# Patient Record
Sex: Male | Born: 1970 | Race: White | Hispanic: No | Marital: Married | Smoking: Current every day smoker
Health system: Southern US, Community
[De-identification: ages and names within clinical notes are randomized; demographics above are authoritative.]

## PROBLEM LIST (undated history)

## (undated) DIAGNOSIS — R569 Unspecified convulsions: Secondary | ICD-10-CM

## (undated) DIAGNOSIS — I10 Essential (primary) hypertension: Secondary | ICD-10-CM

## (undated) HISTORY — PX: OTHER SURGICAL HISTORY: SHX169

## (undated) HISTORY — DX: Essential (primary) hypertension: I10

---

## 2001-01-25 ENCOUNTER — Emergency Department (HOSPITAL_COMMUNITY): Admission: AC | Admit: 2001-01-25 | Discharge: 2001-01-25 | Payer: Self-pay

## 2001-01-25 ENCOUNTER — Encounter: Payer: Self-pay | Admitting: Emergency Medicine

## 2012-08-08 ENCOUNTER — Encounter (HOSPITAL_COMMUNITY): Payer: Self-pay | Admitting: Emergency Medicine

## 2012-08-08 ENCOUNTER — Emergency Department (HOSPITAL_COMMUNITY)
Admission: EM | Admit: 2012-08-08 | Discharge: 2012-08-08 | Disposition: A | Discharge status: Home / Self Care | Payer: Managed Care, Other (non HMO) | Attending: Emergency Medicine | Admitting: Emergency Medicine

## 2012-08-08 DIAGNOSIS — Z79899 Other long term (current) drug therapy: Secondary | ICD-10-CM | POA: Insufficient documentation

## 2012-08-08 DIAGNOSIS — G40909 Epilepsy, unspecified, not intractable, without status epilepticus: Secondary | ICD-10-CM | POA: Insufficient documentation

## 2012-08-08 DIAGNOSIS — R569 Unspecified convulsions: Secondary | ICD-10-CM

## 2012-08-08 HISTORY — DX: Unspecified convulsions: R56.9

## 2012-08-08 LAB — COMPREHENSIVE METABOLIC PANEL
ALT: 17 U/L (ref 0–53)
AST: 16 U/L (ref 0–37)
Albumin: 4 g/dL (ref 3.5–5.2)
Alkaline Phosphatase: 67 U/L (ref 39–117)
Chloride: 94 mEq/L — ABNORMAL LOW (ref 96–112)
Potassium: 3.8 mEq/L (ref 3.5–5.1)
Sodium: 130 mEq/L — ABNORMAL LOW (ref 135–145)
Total Protein: 7.1 g/dL (ref 6.0–8.3)

## 2012-08-08 MED ORDER — SODIUM CHLORIDE 0.9 % IV SOLN
1000.0000 mg | Freq: Once | INTRAVENOUS | Status: AC
  Administered 2012-08-08: 1000 mg via INTRAVENOUS
  Filled 2012-08-08: qty 10

## 2012-08-08 MED ORDER — LEVETIRACETAM 500 MG PO TABS: 500.0000 mg | ORAL_TABLET | Freq: Two times a day (BID) | ORAL | Status: DC

## 2012-08-08 NOTE — ED Notes (Signed)
Patient discharged using the teach back method patient and his wife both verbalize an understanding.

## 2012-08-08 NOTE — ED Provider Notes (Signed)
Patient had generalized seizure today witnessed by coworkers. His wife reports that he's had several seizures within the past few months. He is presently asymptomatic alert Glasgow Coma Score 15. Case discussed with Dr. Roseanne Reno from neurology plan Keppra IV load prescription One-month supply.  Doug Sou, MD 08/08/12 1249

## 2012-08-08 NOTE — ED Provider Notes (Signed)
Medical screening examination/treatment/procedure(s) were conducted as a shared visit with non-physician practitioner(s) and myself.  I personally evaluated the patient during the encounter  Doug Sou, MD 08/08/12 1744

## 2012-08-08 NOTE — ED Provider Notes (Signed)
History     CSN: [id number]  Arrival date & time 08/08/12  1039   First MD Initiated Contact with Patient 08/08/12 1055      Chief Complaint  Patient presents with  . Seizures    (Consider location/radiation/quality/duration/timing/severity/associated sxs/prior treatment) HPI Comments: Pt presents to the ED for a seizure at work this am.  Seizure was witness by co-worker who states that he was convulsing for approx 30 mins and foaming at the mouth.  Head was protected during seizure and did not sustain any bodily injuries.  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  Was taken off by his PCP because they felt it was no longer needed.  Wife notes he has been having more frequent seizures, approx 6 episodes, over the past few months.  Recent EtOH use yesterday, approx 7 beers but denies illicit drug use.  Denies any chest pain, SOB, headache, dizziness, blurred vision, or weakness.  Patient is a 42 y.o. male presenting with seizures. The history is provided by the patient.  Seizures   Past Medical History  Diagnosis Date  . Seizures     History reviewed. No pertinent past surgical history.  History reviewed. No pertinent family history.  History  Substance Use Topics  . Smoking status: Not on file  . Smokeless tobacco: Not on file  . Alcohol Use: Not on file      Review of Systems  Neurological: Positive for seizures.  All other systems reviewed and are negative.    Allergies  Review of patient's allergies indicates no known allergies.  Home Medications   Current Outpatient Rx  Name  Route  Sig  Dispense  Refill  . loratadine (CLARITIN) 10 MG tablet   Oral   Take 10 mg by mouth daily.         Marland Kitchen olmesartan (BENICAR) 40 MG tablet   Oral   Take 20 mg by mouth daily.           BP 134/78  Temp(Src) 99 F (37.2 C) (Oral)  Resp 20  SpO2 95%  Physical Exam  Nursing note and vitals  reviewed. Constitutional: He is oriented to person, place, and time. He appears well-developed and well-nourished.  HENT:  Head: Normocephalic and atraumatic.  Right Ear: Tympanic membrane and ear canal normal.  Left Ear: Tympanic membrane and ear canal normal.  Mouth/Throat: Uvula is midline, oropharynx is clear and moist and mucous membranes are normal. No oropharyngeal exudate, posterior oropharyngeal edema, posterior oropharyngeal erythema or tonsillar abscesses.  Eyes: Conjunctivae and EOM are normal. Pupils are equal, round, and reactive to light.  Neck: Normal range of motion. Neck supple. No spinous process tenderness and no muscular tenderness present. No rigidity.  Cardiovascular: Normal rate, regular rhythm and normal heart sounds.   Pulmonary/Chest: Effort normal and breath sounds normal.  Abdominal: Soft. Bowel sounds are normal.  Neurological: He is alert and oriented to person, place, and time. He has normal strength. He displays no tremor. No cranial nerve deficit or sensory deficit. He displays no seizure activity.  strenth equal UE and LE bilaterally, no active seizures or tremors  Skin: Skin is warm and dry.  Psychiatric: He has a normal mood and affect.    ED Course  Procedures (including critical care time)   Date: 08/08/2012  Rate: 108  Rhythm: sinus tachycardia  QRS Axis: normal  Intervals: normal  ST/T Wave abnormalities: normal  Conduction Disutrbances:none  Narrative  Interpretation: sinus tach, otherwise normal EKG  Old EKG Reviewed: none available    Labs Reviewed  COMPREHENSIVE METABOLIC PANEL - Abnormal; Notable for the following:    Sodium 130 (*)    Chloride 94 (*)    All other components within normal limits   No results found.   1. Seizure       MDM   Pt presents to the ED for witnessed seizure this am at work.  No head trauma or external injuries.  Prior hx of childhood seizure d/o but not currently medicated.  Wife reports more  frequent episodes of seizures over the past 6 months.  Consulted neurology- advised to load with 1000mg  Keppra followed with Keppra 500mg  BID.  Advised to limit EtOH use while taking this medication.  Follow up with [facility].  Discussed plan with pt and wife- they agreed.  Return precautions advised.       Garlon Hatchet, PA-C 08/08/12 1511
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
## 2012-08-24 ENCOUNTER — Ambulatory Visit (INDEPENDENT_AMBULATORY_CARE_PROVIDER_SITE_OTHER): Payer: Managed Care, Other (non HMO) | Admitting: Neurology

## 2012-08-24 ENCOUNTER — Telehealth: Payer: Self-pay

## 2012-08-24 ENCOUNTER — Encounter: Payer: Self-pay | Admitting: Neurology

## 2012-08-24 VITALS — BP 130/84 | HR 86 | Ht 70.0 in | Wt 206.0 lb

## 2012-08-24 DIAGNOSIS — R569 Unspecified convulsions: Secondary | ICD-10-CM | POA: Insufficient documentation

## 2012-08-24 MED ORDER — LEVETIRACETAM 500 MG PO TABS: 500.0000 mg | ORAL_TABLET | Freq: Two times a day (BID) | ORAL | Status: DC

## 2012-08-24 NOTE — Telephone Encounter (Signed)
Mark Tanner called and said they would like rx sent to [facility] rather than [facility].  I have resent rx.

## 2012-08-24 NOTE — Progress Notes (Signed)
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 Was taken off by his PCP because they felt it was no longer needed. Wife notes he has been having more frequent seizures, approx 6 episodes, over the past few months. Recent EtOH use yesterday, approx 7 beers but denies illicit drug use"  Wife only reported one episode of starring about 3 years ago, came out of a daze, ask his wife who she is.  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   Constitutional:   N/A Cardiovascular:  N/A Ear/Nose/Throat:  Ringing in his ears Endocrine; increased thirst Skin: N/A Eyes: N/A Respiratory: N/A Gastroitestinal: N/A    Hematology/Lymphatic:  N/A Endocrine:  N/A Musculoskeletal: Joints pain Allergy/Immunology: Seizure Neurological: N/A Psychiatric:    N/A  PHYSICAL EXAMINATOINS:  Generalized: In no acute distress  Neck: Supple, no carotid bruits   Cardiac: Regular rate rhythm  Pulmonary: Clear to auscultation bilaterally  Musculoskeletal: No deformity  Neurological examination  Mentation: Alert oriented to time, place, history taking, and  causual conversation  Cranial nerve II-XII: Pupils were equal round reactive to light extraocular movements were full, visual field were full on confrontational test. facial sensation and strength were normal. hearing was intact to finger rubbing bilaterally. Uvula tongue midline.  head turning and shoulder shrug and were normal and symmetric.Tongue protrusion into cheek strength was normal.  Motor: normal tone, bulk and strength.  Sensory: Intact to fine touch, pinprick, preserved vibratory sensation, and proprioception at toes.  Coordination: Normal finger to nose, heel-to-shin bilaterally there was no truncal ataxia  Gait: Rising up from seated position without assistance, normal stance, without trunk ataxia, moderate stride, good arm swing, smooth turning, able to perform tiptoe, and heel walking without difficulty.   Romberg signs: Negative  Deep tendon reflexes: Brachioradialis 2/2, biceps 2/2, triceps 2/2, patellar 2/2, Achilles 2/2, plantar responses were flexor bilaterally.   Assessment and plan 42  Years old right-handed Caucasian male, with past medical history of seizure, now has recurrent seizure 1 complete evaluation was MRI of the brain with and without contrast 2. EEG 3 refill keppra 500 twice a day 4 RTC in 6 months with Eber Jones

## 2012-10-08 ENCOUNTER — Ambulatory Visit (INDEPENDENT_AMBULATORY_CARE_PROVIDER_SITE_OTHER): Payer: Managed Care, Other (non HMO)

## 2012-10-08 DIAGNOSIS — R569 Unspecified convulsions: Secondary | ICD-10-CM

## 2012-10-08 MED ORDER — GADOPENTETATE DIMEGLUMINE 469.01 MG/ML IV SOLN: 20.0000 mL | Freq: Once | INTRAVENOUS | Status: AC | PRN

## 2012-10-12 ENCOUNTER — Telehealth: Payer: Self-pay | Admitting: Neurology

## 2012-10-12 NOTE — Progress Notes (Signed)
Quick Note:  Called and spoke to patient normal MRI. Patient understood. ______ 

## 2013-03-01 ENCOUNTER — Telehealth: Payer: Self-pay | Admitting: *Deleted

## 2013-03-01 NOTE — Telephone Encounter (Signed)
Called patient left a message that their new appt time is 03-12-13 at 2pm arrive at 145pm. If they can not make this time to call back and r/s.

## 2013-03-05 ENCOUNTER — Ambulatory Visit: Payer: Managed Care, Other (non HMO) | Admitting: Nurse Practitioner

## 2013-03-12 ENCOUNTER — Encounter: Payer: Self-pay | Admitting: Nurse Practitioner

## 2013-03-12 ENCOUNTER — Ambulatory Visit (INDEPENDENT_AMBULATORY_CARE_PROVIDER_SITE_OTHER): Payer: Managed Care, Other (non HMO) | Admitting: Nurse Practitioner

## 2013-03-12 ENCOUNTER — Encounter (INDEPENDENT_AMBULATORY_CARE_PROVIDER_SITE_OTHER): Payer: Self-pay

## 2013-03-12 VITALS — BP 133/79 | HR 96 | Ht 71.5 in | Wt 200.0 lb

## 2013-03-12 DIAGNOSIS — R569 Unspecified convulsions: Secondary | ICD-10-CM

## 2013-03-12 NOTE — Progress Notes (Signed)
[facility]  PATIENT: Mark Tanner DOB: 15-Mar-1971   REASON FOR VISIT: Followup for seizure disorder   HISTORY OF PRESENT ILLNESS: Mr. Tally, 42 year old male returns for followup with history of generalized seizure. He is currently on Keppra 500 twice daily tolerating the medication without side effects. He has not had seizure activity since last seen 08/24/2012 when seen by Dr. Terrace Arabia. He is back to driving. MRI of the brain 08/24/2012 was normal.    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 Was taken off by his PCP because they felt it was no longer needed. Wife notes he has been having more frequent seizures, approx 6 episodes, over the past few months. Recent EtOH use yesterday, approx 7 beers but denies illicit drug use"  Wife only reported one episode of starring about 3 years ago, came out of a daze, ask his wife who she is.  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 levETIRAcetam (KEPPRA) 500 MG tablet Take 1 tablet (500 mg total) by mouth 2 (two) times daily.  60 tablet  12  . loratadine (CLARITIN) 10 MG tablet Take 10 mg by mouth daily.      Marland Kitchen olmesartan (BENICAR) 40 MG tablet Take 20 mg by mouth daily.      Marland Kitchen omeprazole (PRILOSEC) 40 MG capsule 40 mg 2 (two) times daily.       No facility-administered medications prior to visit.    PAST MEDICAL HISTORY: Past Medical History  Diagnosis Date  . Seizures     PAST SURGICAL HISTORY: Past Surgical History  Procedure Laterality Date  . None      FAMILY HISTORY: Family History  Problem Relation Age of Onset  . Cancer - Colon Father     SOCIAL HISTORY: History   Social History  . Marital Status: Married    Spouse Name: Elease Hashimoto    Number of Children: 0  . Years of Education: 12   Occupational History  .     Social History Main Topics  . Smoking status: Current Every Day Smoker    Types: Cigarettes  . Smokeless tobacco: Never Used  . Alcohol Use: 0.6 oz/week    1 Cans of beer per week  . Drug Use: No  . Sexual Activity: Not on file   Other Topics Concern  . Not on file   Social History Narrative   Patient is married Elease Hashimoto) and lives with his wife.   Patient works full-time.   Patient has a high school education.  Patient is right-handed.   Patient drinks three caffeine drinks daily.     PHYSICAL EXAM  Filed Vitals:   03/12/13 1343  BP: 133/79  Pulse: 96  Height: 5' 11.5" (1.816 m)  Weight: 200 lb (90.719 kg)   Body mass index is 27.51 kg/(m^2).  Generalized: Well developed, in no acute distress  Neurological examination   Mentation: Alert oriented to time, place, history taking. Follows all commands speech and language fluent  Cranial nerve II-XII: Fundoscopic exam reveals sharp disc margins.Pupils were equal round reactive to light extraocular movements were full,  visual field were full on confrontational test. Facial sensation and strength were normal. hearing was intact to finger rubbing bilaterally. Uvula tongue midline. head turning and shoulder shrug and were normal and symmetric.Tongue protrusion into cheek strength was normal. Motor: normal bulk and tone, full strength in the BUE, BLE, fine finger movements normal, no pronator drift. No focal weakness Sensory: normal and symmetric to light touch, pinprick, and  vibration  Coordination: finger-nose-finger, heel-to-shin bilaterally, no dysmetria Reflexes: Brachioradialis 2/2, biceps 2/2, triceps 2/2, patellar 2/2, Achilles 2/2, plantar responses were flexor bilaterally. Gait and Station: Rising up from seated position without assistance, normal stance,  moderate stride, good arm swing, smooth turning, able to perform tiptoe, and heel walking without difficulty.   DIAGNOSTIC DATA (LABS, IMAGING, TESTING) - I reviewed patient records, labs, notes, testing and imaging myself where available.      Component Value Date/Time   NA 130* 08/08/2012 1111   K 3.8 08/08/2012 1111   CL 94* 08/08/2012 1111   CO2 27 08/08/2012 1111   GLUCOSE 99 08/08/2012 1111   BUN 16 08/08/2012 1111   CREATININE 0.93 08/08/2012 1111   CALCIUM 9.0 08/08/2012 1111   PROT 7.1 08/08/2012 1111   ALBUMIN 4.0 08/08/2012 1111   AST 16 08/08/2012 1111   ALT 17 08/08/2012 1111   ALKPHOS 67 08/08/2012 1111   BILITOT 0.3 08/08/2012 1111   GFRNONAA >90 08/08/2012 1111   GFRAA >90 08/08/2012 1111    ASSESSMENT AND PLAN  42 y.o. year old male  has a past medical history of Seizures. here to followup. Currently on Keppra 500 twice a day. MRI of the brain 08/24/2012 with normal.  Continue Keppra 500 twice daily Does not need refills Call for any seizure activity Limit alcohol as it lowers seizure threshold Followup yearly and when necessary Nilda Riggs, [facility], [facility], APRN  [facility] [address] [place], [place]  [address] [phone]

## 2013-03-12 NOTE — Patient Instructions (Signed)
Continue Keppra 500 twice daily Does not need refills Call for any seizure activity Followup yearly and when necessary

## 2013-06-14 ENCOUNTER — Other Ambulatory Visit: Payer: Self-pay

## 2013-06-14 MED ORDER — LEVETIRACETAM 500 MG PO TABS: 500.0000 mg | ORAL_TABLET | Freq: Two times a day (BID) | ORAL | Status: DC

## 2013-12-09 ENCOUNTER — Telehealth: Payer: Self-pay | Admitting: Neurology

## 2013-12-09 NOTE — Telephone Encounter (Signed)
Patient's wife [name] calling to ask if it is okay for patient to take hydrocodone and amoxicillin with his keppra, patient had 2 wisdom teeth pulled today and that's what they gave the patient, please call and advise, she wants to know before tonight if she can give him these meds.

## 2013-12-10 NOTE — Telephone Encounter (Signed)
Left message that it is ok for patient to take hydrocodone and amoxicillin with his Keppra.  Told to call with further questions.

## 2013-12-10 NOTE — Telephone Encounter (Signed)
Butch Penny: Please let patient wife know, yes, it is ok for him to take hydrocodone and amoxicllin, he should continue his keppra as previously ordered.

## 2014-03-18 ENCOUNTER — Ambulatory Visit (INDEPENDENT_AMBULATORY_CARE_PROVIDER_SITE_OTHER): Payer: Managed Care, Other (non HMO) | Admitting: Neurology

## 2014-03-18 ENCOUNTER — Encounter: Payer: Self-pay | Admitting: Neurology

## 2014-03-18 VITALS — BP 128/76 | HR 87 | Ht 71.5 in | Wt 196.0 lb

## 2014-03-18 DIAGNOSIS — R569 Unspecified convulsions: Secondary | ICD-10-CM

## 2014-03-18 DIAGNOSIS — I10 Essential (primary) hypertension: Secondary | ICD-10-CM

## 2014-03-18 MED ORDER — LEVETIRACETAM 500 MG PO TABS: 500.0000 mg | ORAL_TABLET | Freq: Two times a day (BID) | ORAL | Status: DC

## 2014-03-18 NOTE — Progress Notes (Signed)
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 Was taken off by his PCP because they felt it was no longer needed. Wife notes he has been having more frequent seizures, approx 6 episodes, over the past few months. Also reported a history of alcohol use, approx 7 beers but denies illicit drug use"   Wife only reported one episode of starring 2010, came out of a daze, ask his wife who she is.  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 benzonatate (TESSALON) 100 MG capsule 100 mg as needed.    . cefdinir (OMNICEF) 300 MG capsule 300 mg.    . levETIRAcetam (KEPPRA) 500 MG tablet Take 1 tablet (500 mg total) by mouth 2 (two) times daily. 180 tablet 2  . loratadine (CLARITIN) 10 MG tablet Take 10 mg by mouth daily.    Marland Kitchen olmesartan (BENICAR) 40 MG tablet Take 20 mg by mouth daily.    Marland Kitchen omeprazole (PRILOSEC) 40 MG capsule 40 mg 2 (two) times daily.     No facility-administered medications prior to visit.    PAST MEDICAL HISTORY: Past Medical History  Diagnosis Date  . Seizures   . High blood pressure     PAST SURGICAL  HISTORY: Past Surgical History  Procedure Laterality Date  . None      FAMILY HISTORY: Family History  Problem Relation Age of Onset  . Cancer - Colon Father     SOCIAL HISTORY: History   Social History  . Marital Status: Married    Spouse Name: [name]    Number of Children: 0  . Years of Education: 12   Occupational History  .     Social History Main Topics  . Smoking status: Current Every Day Smoker    Types: Cigarettes  . Smokeless tobacco: Never Used  . Alcohol Use: 0.6 oz/week    1 Cans of beer per week  . Drug Use: No  . Sexual Activity: Not on file   Other Topics Concern  . Not on file   Social History Narrative   Patient is married [name]) and lives with his wife.   Patient works full-time.   Patient has a high school education.   Patient is right-handed.   Patient drinks three caffeine drinks daily.     PHYSICAL EXAM  Filed Vitals:   03/18/14 0835  BP: 128/76  Pulse: 87  Height: 5' 11.5" (1.816 m)  Weight: 196 lb (88.905 kg)   Body mass index is 26.96 kg/(m^2).  Generalized: Well developed, in no acute distress  Neurological examination   Mentation: Alert oriented to time, place, history taking. Follows all commands speech and language fluent  Cranial nerve II-XII: Fundoscopic exam reveals sharp disc margins.Pupils were equal round reactive to light extraocular movements were full, visual field were full on confrontational test. Facial sensation and strength were normal. hearing was intact to finger rubbing bilaterally. Uvula tongue midline. head turning and shoulder shrug and were normal and symmetric.Tongue protrusion into cheek strength was normal. Motor: normal bulk and tone, full strength in the BUE, BLE, fine finger movements normal, no pronator drift. No focal weakness Sensory: normal and symmetric to light touch, pinprick, and  vibration  Coordination: finger-nose-finger, heel-to-shin bilaterally, no dysmetria Reflexes:  Brachioradialis 2/2, biceps 2/2, triceps 2/2, patellar 2/2, Achilles 2/2, plantar responses were flexor bilaterally. Gait and Station: Rising up from seated position without assistance, normal stance,  moderate stride, good arm swing, smooth turning, able to perform tiptoe, and heel walking without difficulty.   DIAGNOSTIC DATA (LABS, IMAGING, TESTING) - I reviewed patient records, labs, notes, testing and imaging myself where available.      Component Value Date/Time   NA 130* 08/08/2012 1111   K 3.8 08/08/2012 1111   CL 94* 08/08/2012 1111   CO2 27 08/08/2012 1111   GLUCOSE 99 08/08/2012 1111   BUN 16 08/08/2012 1111   CREATININE 0.93 08/08/2012 1111   CALCIUM 9.0 08/08/2012 1111   PROT 7.1 08/08/2012 1111   ALBUMIN 4.0 08/08/2012 1111   AST 16 08/08/2012 1111   ALT 17 08/08/2012 1111   ALKPHOS 67 08/08/2012 1111   BILITOT 0.3 08/08/2012 1111   GFRNONAA >90 08/08/2012 1111   GFRAA >90 08/08/2012 1111    ASSESSMENT AND PLAN  43 y.o. year old male with past medical history of seizure, doing very well on Keppra 500 mg twice a day, no significant side effect,  Refill his medications, Return to clinic in one year with nurse practitioner  [facility] [address] [place], [place] [address] [phone]

## 2014-06-13 ENCOUNTER — Telehealth: Payer: Self-pay | Admitting: Neurology

## 2014-06-13 NOTE — Telephone Encounter (Signed)
Patient would like your clearance to start Losartan HCTZ due to his seizure disorder and Keppra.

## 2014-06-13 NOTE — Telephone Encounter (Signed)
Patient's spouse stated Dr. Tollie Pizza prescribed a new medication for blood pressure.  Spouse is questioning if it's ok to start taking Rx LOSARTIN HCT 50/12.5 mg, due to one of the side effects were seizures.  Please call and advise.

## 2014-06-13 NOTE — Telephone Encounter (Signed)
Mark Tanner: Please call back patient, it is okay for her to take blood pressure medications prescribed by her primary care physician. It has no interaction with keppra

## 2014-06-13 NOTE — Telephone Encounter (Signed)
Aware that is is safe to start new BP medication.

## 2015-01-12 ENCOUNTER — Telehealth: Payer: Self-pay | Admitting: Neurology

## 2015-01-12 MED ORDER — LEVETIRACETAM 500 MG PO TABS
500.0000 mg | ORAL_TABLET | Freq: Two times a day (BID) | ORAL | Status: DC
Start: 2015-01-12 — End: 2015-04-10

## 2015-01-12 NOTE — Telephone Encounter (Signed)
Patient has appt in Nov.  Refill sent to [facility] per patient request.  Receipt confirmed by pharmacy.  I called back to advise.  They are aware.

## 2015-01-12 NOTE — Telephone Encounter (Signed)
pts wife called and states he will run out of medication before appt. Can he have a rx. levETIRAcetam (KEPPRA) 500 MG tablet. Please use [facility] home delivery . Thank you. Wife would like a call about status [phone]

## 2015-03-06 ENCOUNTER — Ambulatory Visit (INDEPENDENT_AMBULATORY_CARE_PROVIDER_SITE_OTHER): Payer: Self-pay | Admitting: Adult Health

## 2015-03-06 ENCOUNTER — Encounter: Payer: Self-pay | Admitting: Adult Health

## 2015-03-06 VITALS — BP 142/88 | HR 83 | Ht 71.0 in | Wt 196.5 lb

## 2015-03-06 DIAGNOSIS — R569 Unspecified convulsions: Secondary | ICD-10-CM

## 2015-03-06 NOTE — Progress Notes (Signed)
PATIENT: Mark Tanner DOB: 11/28/1970  REASON FOR VISIT: follow up- seizures HISTORY FROM: patient  HISTORY OF PRESENT ILLNESS: Mr. Lekas is a 44 year old male with a history of seizures. He returns today for follow-up. He is currently taking Keppra 500 mg twice a day and tolerating it well. He reports that he has not had a seizure in over 3 years. Denies any changes with his gait or balance. He is able to complete all ADLs independently. He doesn't operate a Teacher, music. Unfortunately he reports that he was recently fired from his job of 17 years. He states that this has added a lot of stress. He is currently in the process of trying to secure another job. He denies any new medical issues. He returns today for follow-up.  HISTORY 03/18/14 Krista Blue): Refill medication for seizure  Most recent generalized seizure was in April 1st 2013,  Patient and his wife is very reluctant to provide his previous history of seizure, but from review emergency record, "Hx of childhood seizure disorder for which he was medicated- wife notes it was tegretol and dilantin. Was taken off by his PCP because they felt it was no longer needed. Wife notes he has been having more frequent seizures, approx 6 episodes, over the past few months. Also reported a history of alcohol use, approx 7 beers but denies illicit drug use"   Wife only reported one episode of starring 2010, came out of a daze, ask his wife who she is.  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  See history of present illness  ALLERGIES: No Known  Allergies  HOME MEDICATIONS: Outpatient Prescriptions Prior to Visit  Medication Sig Dispense Refill  . levETIRAcetam (KEPPRA) 500 MG tablet Take 1 tablet (500 mg total) by mouth 2 (two) times daily. 180 tablet 0  . loratadine (CLARITIN) 10 MG tablet Take 10 mg by mouth daily.    Marland Kitchen olmesartan (BENICAR) 40 MG tablet Take 20 mg by mouth daily.    . benzonatate (TESSALON) 100 MG capsule 100 mg as needed.    . cefdinir (OMNICEF) 300 MG capsule 300 mg.    . omeprazole (PRILOSEC) 40 MG capsule 40 mg 2 (two) times daily.     No facility-administered medications prior to visit.    PAST MEDICAL HISTORY: Past Medical History  Diagnosis Date  . Seizures ([place])   . High blood pressure     PAST SURGICAL HISTORY: Past Surgical History  Procedure Laterality Date  . None      FAMILY HISTORY: Family History  Problem Relation Age of Onset  . Cancer - Colon Father     SOCIAL HISTORY: Social History   Social History  . Marital Status: Married    Spouse Name: [name]  . Number of Children: 0  . Years of Education: 12   Occupational History  .     Social History Main Topics  . Smoking status: Current Every Day Smoker -- 1.00 packs/day    Types: Cigarettes  . Smokeless tobacco: Never Used  . Alcohol Use: 0.6 oz/week    1 Cans of beer per week  .  Drug Use: No  . Sexual Activity: Not on file   Other Topics Concern  . Not on file   Social History Narrative   Patient is married [name]) and lives with his wife.   Patient works full-time.   Patient has a high school education.   Patient is right-handed.   Patient drinks three caffeine drinks daily.      PHYSICAL EXAM  Filed Vitals:   03/06/15 0810  BP: 142/88  Pulse: 83  Height: 5\' 11"  (1.803 m)  Weight: 196 lb 8 oz (89.132 kg)   Body mass index is 27.42 kg/(m^2).  Generalized: Well developed, in no acute distress   Neurological examination  Mentation: Alert oriented to time, place, history taking. Follows all  commands speech and language fluent Cranial nerve II-XII: Pupils were equal round reactive to light. Extraocular movements were full, visual field were full on confrontational test. Facial sensation and strength were normal. Uvula tongue midline. Head turning and shoulder shrug  were normal and symmetric. Motor: The motor testing reveals 5 over 5 strength of all 4 extremities. Good symmetric motor tone is noted throughout.  Sensory: Sensory testing is intact to soft touch on all 4 extremities. No evidence of extinction is noted.  Coordination: Cerebellar testing reveals good finger-nose-finger and heel-to-shin bilaterally.  Gait and station: Gait is normal. Tandem gait is normal. Romberg is negative. No drift is seen.  Reflexes: Deep tendon reflexes are symmetric and normal bilaterally.   DIAGNOSTIC DATA (LABS, IMAGING, TESTING) - I reviewed patient records, labs, notes, testing and imaging myself where available.     ASSESSMENT AND PLAN 44 y.o. year old male  has a past medical history of Seizures ([place]) and High blood pressure. here with:  1. Seizures  Overall the patient is doing well. He will continue on Keppra 500 mg twice a day. I discussed with the patient potential triggers for seizures such as sleep deprivation, increased stress and missing medication. Patient verbalized understanding. If he has any seizure events he should let us know. He will follow-up in one year or sooner if needed.   Ward Givens, MSN, NP-C 03/06/2015, 9:01 AM [facility] [address], [place], [place] [address] [phone]

## 2015-03-06 NOTE — Progress Notes (Signed)
I have reviewed and agreed above plan. 

## 2015-03-06 NOTE — Patient Instructions (Addendum)
Continue Keppra If you have any seizure events please let us know.   

## 2015-03-17 ENCOUNTER — Ambulatory Visit: Payer: Managed Care, Other (non HMO) | Admitting: Adult Health

## 2015-03-27 ENCOUNTER — Ambulatory Visit: Payer: Managed Care, Other (non HMO) | Admitting: Adult Health

## 2015-04-03 ENCOUNTER — Ambulatory Visit: Payer: Managed Care, Other (non HMO) | Admitting: Adult Health

## 2015-04-03 ENCOUNTER — Telehealth: Payer: Self-pay | Admitting: Adult Health

## 2015-04-03 NOTE — Telephone Encounter (Signed)
Patient's wife is calling back and states she left a form at the front desk this morning for her husband to get assistance with his Rx Keppra.  She states she failed to mention a Rx needs to be attached to the form.  Thanks!

## 2015-04-06 NOTE — Telephone Encounter (Signed)
Noted  

## 2015-04-10 ENCOUNTER — Other Ambulatory Visit: Payer: Self-pay

## 2015-04-10 MED ORDER — LEVETIRACETAM 500 MG PO TABS: 500.0000 mg | ORAL_TABLET | Freq: Two times a day (BID) | ORAL | Status: DC

## 2015-04-10 NOTE — Telephone Encounter (Signed)
Patient's wife is calling back to see if form has been faxed to [facility].  Thanks!

## 2015-04-10 NOTE — Telephone Encounter (Signed)
All info was sent.  I called back to advise.  They expressed understanding and appreciation.

## 2015-04-10 NOTE — Telephone Encounter (Signed)
Rx has been signed and faxed  

## 2015-04-10 NOTE — Telephone Encounter (Signed)
Patient assist requires written Rx.  Thank you.

## 2015-04-11 NOTE — Telephone Encounter (Addendum)
Pt's wife called and states that she spoke with a rep from [employer] today and they do not have pts form faxed to them. May refax to [phone] or forms may be emailed to them [email or web address]. Please call and advise pt that it has been resent and the status. Thank you [phone]

## 2015-04-11 NOTE — Telephone Encounter (Signed)
The info has been sent.  We did receive confirmation from the program.  I called the program and spoke with Metta Clines.  She confirmed they did receive the enrollment, however, it is still being processed.  Asked that the patient allow another 24-48 hours for them to finish entering info.  I called the patient back.  Spoke with [name].  She expressed understanding, and will follow up with them in 24-48 hours.

## 2016-02-19 ENCOUNTER — Other Ambulatory Visit: Payer: Self-pay | Admitting: *Deleted

## 2016-02-19 MED ORDER — LEVETIRACETAM 500 MG PO TABS
500.0000 mg | ORAL_TABLET | Freq: Two times a day (BID) | ORAL | 3 refills | Status: DC
Start: 2016-02-19 — End: 2016-05-01

## 2016-03-05 ENCOUNTER — Ambulatory Visit: Payer: Self-pay | Admitting: Adult Health

## 2016-04-30 ENCOUNTER — Ambulatory Visit: Payer: Self-pay | Admitting: Adult Health

## 2016-05-01 ENCOUNTER — Encounter: Payer: Self-pay | Admitting: Neurology

## 2016-05-01 ENCOUNTER — Ambulatory Visit (INDEPENDENT_AMBULATORY_CARE_PROVIDER_SITE_OTHER): Payer: Managed Care, Other (non HMO) | Admitting: Neurology

## 2016-05-01 VITALS — BP 118/74 | HR 90 | Ht 71.0 in | Wt 186.0 lb

## 2016-05-01 DIAGNOSIS — R569 Unspecified convulsions: Secondary | ICD-10-CM

## 2016-05-01 MED ORDER — LEVETIRACETAM 500 MG PO TABS: 500.0000 mg | ORAL_TABLET | Freq: Two times a day (BID) | ORAL | 4 refills | Status: DC

## 2016-05-01 NOTE — Progress Notes (Signed)
PATIENT: Mark Tanner DOB: 16-Jun-1970  REASON FOR VISIT: follow up- seizures HISTORY FROM: patient  HISTORY OF PRESENT ILLNESS:  HISTORY 03/18/14 Krista Blue): Refill medication for seizure  Most recent generalized seizure was in April 1st 2013,  Patient and his wife is very reluctant to provide his previous history of seizure, but from review emergency record, "Hx of childhood seizure disorder for which he was medicated- wife notes it was tegretol and dilantin. Was taken off by his PCP because they felt it was no longer needed. Wife notes he has been having more frequent seizures, approx 6 episodes, over the past few months. Also reported a history of alcohol use, approx 7 beers but denies illicit drug use"   Wife only reported one episode of starring 2010, came out of a daze, ask his wife who she is.  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  See history of present illness  ALLERGIES: No Known Allergies  HOME MEDICATIONS: Outpatient Medications Prior to Visit  Medication Sig Dispense Refill  . levETIRAcetam (KEPPRA) 500 MG tablet Take 1 tablet (500 mg total) by mouth 2 (two) times daily. 180 tablet 3  . loratadine (CLARITIN) 10 MG tablet Take 10 mg by mouth daily.    Marland Kitchen olmesartan (BENICAR) 40 MG tablet Take 20 mg by mouth daily.     No facility-administered medications prior to visit.     PAST MEDICAL HISTORY: Past Medical History:  Diagnosis Date  . High blood pressure   . Seizures  ([place])     PAST SURGICAL HISTORY: Past Surgical History:  Procedure Laterality Date  . None      FAMILY HISTORY: Family History  Problem Relation Age of Onset  . Cancer - Colon Father     SOCIAL HISTORY: Social History   Social History  . Marital status: Married    Spouse name: [name]  . Number of children: 0  . Years of education: 12   Occupational History  .  [employer];   Social History Main Topics  . Smoking status: Current Every Day Smoker    Packs/day: 1.00    Types: Cigarettes  . Smokeless tobacco: Never Used  . Alcohol use 0.6 oz/week    1 Cans of beer per week  . Drug use: No  . Sexual activity: Not on file   Other Topics Concern  . Not on file   Social History Narrative   Patient is married [name]) and lives with his wife.   Patient works full-time.   Patient has a high school education.   Patient is right-handed.   Patient drinks three caffeine drinks daily.      PHYSICAL EXAM  Vitals:   05/01/16 0906  BP: 118/74  Pulse: 90  Weight: 186 lb (84.4 kg)  Height: 5\' 11"  (1.803 m)   Body mass index is 25.94 kg/m.  PHYSICAL EXAMNIATION:  Gen: NAD, conversant, well nourised, obese, well groomed  Cardiovascular: Regular rate rhythm, no peripheral edema, warm, nontender. Eyes: Conjunctivae clear without exudates or hemorrhage Neck: Supple, no carotid bruits. Pulmonary: Clear to auscultation bilaterally   NEUROLOGICAL EXAM:  MENTAL STATUS: Speech:    Speech is normal; fluent and spontaneous with normal comprehension.  Cognition:     Orientation to time, place and person     Normal recent and remote memory     Normal Attention span and concentration     Normal Language, naming, repeating,spontaneous speech     Fund of knowledge   CRANIAL NERVES: CN II: Visual fields are full to confrontation. Fundoscopic exam is normal with sharp discs and no vascular changes. Pupils are round equal and briskly reactive to  light. CN III, IV, VI: extraocular movement are normal. No ptosis. CN V: Facial sensation is intact to pinprick in all 3 divisions bilaterally. Corneal responses are intact.  CN VII: Face is symmetric with normal eye closure and smile. CN VIII: Hearing is normal to rubbing fingers CN IX, X: Palate elevates symmetrically. Phonation is normal. CN XI: Head turning and shoulder shrug are intact CN XII: Tongue is midline with normal movements and no atrophy.  MOTOR: There is no pronator drift of out-stretched arms. Muscle bulk and tone are normal. Muscle strength is normal.  REFLEXES: Reflexes are 2+ and symmetric at the biceps, triceps, knees, and ankles. Plantar responses are flexor.  SENSORY: Intact to light touch, pinprick, positional and vibratory sensation are intact in fingers and toes.  COORDINATION: Rapid alternating movements and fine finger movements are intact. There is no dysmetria on finger-to-nose and heel-knee-shin.    GAIT/STANCE: Posture is normal. Gait is steady with normal steps, base, arm swing, and turning. Heel and toe walking are normal. Tandem gait is normal.  Romberg is absent.  DIAGNOSTIC DATA (LABS, IMAGING, TESTING) - I reviewed patient records, labs, notes, testing and imaging myself where available.  ASSESSMENT AND PLAN 45 y.o. year old   Epilepsy: Overall the patient is doing well. He will continue on Keppra 500 mg twice a day Continue refill with his primary care physician  Marcial Pacas, M.D. Ph.D.  [facility] [place], [place] [address] Phone: [phone] Fax:      [phone]

## 2019-03-22 ENCOUNTER — Telehealth: Payer: Self-pay | Admitting: Neurology

## 2019-03-22 NOTE — Telephone Encounter (Signed)
I returned the call to the patient. He was last seen in our office in 04/2016.  He has been followed by his PCP since that time, who has been prescribing his Keppra.  He was instructed to call his PCP to request the refill.  Says he was last seen in their office three weeks ago for back pain.  He is concerned because he cannot go to his PCP right now for another appt because he has just lost his job.  He has enough medication to last him through the end of December.  I feel like his PCP will be helpful, if he has maintained a relationship with that office.  Additionally, I suggested he contact his local health dept/social services to apply for assistance while unemployed.  He verbalized understanding.

## 2019-03-22 NOTE — Telephone Encounter (Signed)
Pt is needing a refill on his levETIRAcetam (KEPPRA) 500 MG tablet It was explained that he has not been to the office for 2 years and is needing a medication f/u appt. Pt states he was informed that all he had to do was to call in his medication. Pt needs to discuss with RN.

## 2019-06-11 ENCOUNTER — Other Ambulatory Visit: Payer: Self-pay | Admitting: Orthopedic Surgery

## 2019-06-11 DIAGNOSIS — R1900 Intra-abdominal and pelvic swelling, mass and lump, unspecified site: Secondary | ICD-10-CM

## 2019-06-11 DIAGNOSIS — M545 Low back pain, unspecified: Secondary | ICD-10-CM

## 2019-06-14 ENCOUNTER — Other Ambulatory Visit: Payer: Self-pay | Admitting: Orthopedic Surgery

## 2019-06-14 ENCOUNTER — Telehealth: Payer: Self-pay | Admitting: Hematology & Oncology

## 2019-06-14 DIAGNOSIS — M545 Low back pain, unspecified: Secondary | ICD-10-CM

## 2019-06-14 NOTE — Telephone Encounter (Signed)
Called and spoke with wife regarding appointments add and gave her specific instructions on location and advised her to arrive 30 minutes early for appt.  She asked to be transferred to [facility]. Couns due to no insurance.  Call was then transferred to Otilio Carpen

## 2019-06-17 ENCOUNTER — Other Ambulatory Visit: Payer: Managed Care, Other (non HMO)

## 2019-06-17 ENCOUNTER — Encounter: Payer: Self-pay | Admitting: Hematology & Oncology

## 2019-06-17 ENCOUNTER — Telehealth: Payer: Self-pay | Admitting: *Deleted

## 2019-06-17 ENCOUNTER — Ambulatory Visit
Admission: RE | Admit: 2019-06-17 | Discharge: 2019-06-17 | Disposition: A | Discharge status: Home / Self Care | Payer: No Typology Code available for payment source | Source: Ambulatory Visit | Attending: Orthopedic Surgery | Admitting: Orthopedic Surgery

## 2019-06-17 ENCOUNTER — Inpatient Hospital Stay: Payer: Self-pay | Attending: Hematology & Oncology

## 2019-06-17 ENCOUNTER — Inpatient Hospital Stay (HOSPITAL_BASED_OUTPATIENT_CLINIC_OR_DEPARTMENT_OTHER): Payer: Self-pay | Admitting: Family

## 2019-06-17 ENCOUNTER — Encounter (HOSPITAL_COMMUNITY): Payer: Self-pay | Admitting: Hematology & Oncology

## 2019-06-17 ENCOUNTER — Inpatient Hospital Stay: Payer: Self-pay

## 2019-06-17 ENCOUNTER — Encounter: Payer: Self-pay | Admitting: *Deleted

## 2019-06-17 ENCOUNTER — Inpatient Hospital Stay (HOSPITAL_COMMUNITY)
Admission: AD | Admit: 2019-06-17 | Discharge: 2019-06-25 | DRG: 516 | Disposition: A | Discharge status: Hospice | Payer: Self-pay | Source: Ambulatory Visit | Attending: Hematology & Oncology | Admitting: Hematology & Oncology

## 2019-06-17 ENCOUNTER — Other Ambulatory Visit: Payer: Self-pay

## 2019-06-17 ENCOUNTER — Ambulatory Visit
Admission: RE | Admit: 2019-06-17 | Discharge: 2019-06-17 | Disposition: A | Discharge status: Home / Self Care | Payer: Self-pay | Source: Ambulatory Visit | Attending: Orthopedic Surgery | Admitting: Orthopedic Surgery

## 2019-06-17 VITALS — BP 97/69 | HR 94 | Temp 97.1°F | Resp 20

## 2019-06-17 VITALS — BP 117/63 | HR 84 | Resp 18

## 2019-06-17 DIAGNOSIS — Z20822 Contact with and (suspected) exposure to covid-19: Secondary | ICD-10-CM | POA: Diagnosis present

## 2019-06-17 DIAGNOSIS — R64 Cachexia: Secondary | ICD-10-CM | POA: Diagnosis present

## 2019-06-17 DIAGNOSIS — R531 Weakness: Secondary | ICD-10-CM | POA: Insufficient documentation

## 2019-06-17 DIAGNOSIS — R569 Unspecified convulsions: Secondary | ICD-10-CM | POA: Insufficient documentation

## 2019-06-17 DIAGNOSIS — M5441 Lumbago with sciatica, right side: Secondary | ICD-10-CM

## 2019-06-17 DIAGNOSIS — M545 Low back pain, unspecified: Secondary | ICD-10-CM

## 2019-06-17 DIAGNOSIS — M6259 Muscle wasting and atrophy, not elsewhere classified, multiple sites: Secondary | ICD-10-CM | POA: Diagnosis present

## 2019-06-17 DIAGNOSIS — Z7409 Other reduced mobility: Secondary | ICD-10-CM

## 2019-06-17 DIAGNOSIS — M8458XG Pathological fracture in neoplastic disease, other specified site, subsequent encounter for fracture with delayed healing: Secondary | ICD-10-CM

## 2019-06-17 DIAGNOSIS — K5900 Constipation, unspecified: Secondary | ICD-10-CM | POA: Diagnosis present

## 2019-06-17 DIAGNOSIS — Z801 Family history of malignant neoplasm of trachea, bronchus and lung: Secondary | ICD-10-CM

## 2019-06-17 DIAGNOSIS — N179 Acute kidney failure, unspecified: Secondary | ICD-10-CM | POA: Diagnosis present

## 2019-06-17 DIAGNOSIS — Z781 Physical restraint status: Secondary | ICD-10-CM

## 2019-06-17 DIAGNOSIS — Z79899 Other long term (current) drug therapy: Secondary | ICD-10-CM | POA: Insufficient documentation

## 2019-06-17 DIAGNOSIS — C7800 Secondary malignant neoplasm of unspecified lung: Secondary | ICD-10-CM | POA: Diagnosis present

## 2019-06-17 DIAGNOSIS — G40909 Epilepsy, unspecified, not intractable, without status epilepticus: Secondary | ICD-10-CM | POA: Diagnosis present

## 2019-06-17 DIAGNOSIS — Z800 Family history of malignant neoplasm of digestive organs: Secondary | ICD-10-CM | POA: Insufficient documentation

## 2019-06-17 DIAGNOSIS — F17210 Nicotine dependence, cigarettes, uncomplicated: Secondary | ICD-10-CM | POA: Insufficient documentation

## 2019-06-17 DIAGNOSIS — D630 Anemia in neoplastic disease: Secondary | ICD-10-CM | POA: Diagnosis present

## 2019-06-17 DIAGNOSIS — G893 Neoplasm related pain (acute) (chronic): Secondary | ICD-10-CM | POA: Diagnosis present

## 2019-06-17 DIAGNOSIS — C641 Malignant neoplasm of right kidney, except renal pelvis: Secondary | ICD-10-CM | POA: Insufficient documentation

## 2019-06-17 DIAGNOSIS — C7951 Secondary malignant neoplasm of bone: Secondary | ICD-10-CM

## 2019-06-17 DIAGNOSIS — E860 Dehydration: Secondary | ICD-10-CM | POA: Insufficient documentation

## 2019-06-17 DIAGNOSIS — R1900 Intra-abdominal and pelvic swelling, mass and lump, unspecified site: Secondary | ICD-10-CM

## 2019-06-17 DIAGNOSIS — R451 Restlessness and agitation: Secondary | ICD-10-CM | POA: Diagnosis present

## 2019-06-17 DIAGNOSIS — C787 Secondary malignant neoplasm of liver and intrahepatic bile duct: Secondary | ICD-10-CM | POA: Insufficient documentation

## 2019-06-17 DIAGNOSIS — C770 Secondary and unspecified malignant neoplasm of lymph nodes of head, face and neck: Secondary | ICD-10-CM | POA: Diagnosis present

## 2019-06-17 DIAGNOSIS — N289 Disorder of kidney and ureter, unspecified: Secondary | ICD-10-CM | POA: Insufficient documentation

## 2019-06-17 DIAGNOSIS — E871 Hypo-osmolality and hyponatremia: Secondary | ICD-10-CM | POA: Diagnosis present

## 2019-06-17 DIAGNOSIS — Z66 Do not resuscitate: Secondary | ICD-10-CM | POA: Diagnosis not present

## 2019-06-17 DIAGNOSIS — C801 Malignant (primary) neoplasm, unspecified: Secondary | ICD-10-CM

## 2019-06-17 DIAGNOSIS — Z515 Encounter for palliative care: Secondary | ICD-10-CM | POA: Diagnosis not present

## 2019-06-17 DIAGNOSIS — Z7189 Other specified counseling: Secondary | ICD-10-CM

## 2019-06-17 DIAGNOSIS — Z7401 Bed confinement status: Secondary | ICD-10-CM

## 2019-06-17 DIAGNOSIS — Z681 Body mass index (BMI) 19 or less, adult: Secondary | ICD-10-CM

## 2019-06-17 DIAGNOSIS — F419 Anxiety disorder, unspecified: Secondary | ICD-10-CM | POA: Diagnosis present

## 2019-06-17 DIAGNOSIS — M8458XA Pathological fracture in neoplastic disease, other specified site, initial encounter for fracture: Principal | ICD-10-CM | POA: Diagnosis present

## 2019-06-17 DIAGNOSIS — G8929 Other chronic pain: Secondary | ICD-10-CM

## 2019-06-17 DIAGNOSIS — C786 Secondary malignant neoplasm of retroperitoneum and peritoneum: Secondary | ICD-10-CM | POA: Diagnosis present

## 2019-06-17 HISTORY — DX: Hypercalcemia: E83.52

## 2019-06-17 HISTORY — DX: Secondary malignant neoplasm of bone: C79.51

## 2019-06-17 HISTORY — DX: Malignant (primary) neoplasm, unspecified: C80.1

## 2019-06-17 HISTORY — DX: Cachexia: R64

## 2019-06-17 LAB — CBC WITH DIFFERENTIAL (CANCER CENTER ONLY)
Abs Immature Granulocytes: 0.19 10*3/uL — ABNORMAL HIGH (ref 0.00–0.07)
Basophils Absolute: 0 10*3/uL (ref 0.0–0.1)
Basophils Relative: 0 %
Eosinophils Absolute: 0.1 10*3/uL (ref 0.0–0.5)
Eosinophils Relative: 0 %
HCT: 30.9 % — ABNORMAL LOW (ref 39.0–52.0)
Hemoglobin: 10.8 g/dL — ABNORMAL LOW (ref 13.0–17.0)
Immature Granulocytes: 1 %
Lymphocytes Relative: 7 %
Lymphs Abs: 1.1 10*3/uL (ref 0.7–4.0)
MCH: 29.2 pg (ref 26.0–34.0)
MCHC: 35 g/dL (ref 30.0–36.0)
MCV: 83.5 fL (ref 80.0–100.0)
Monocytes Absolute: 1 10*3/uL (ref 0.1–1.0)
Monocytes Relative: 7 %
Neutro Abs: 12.7 10*3/uL — ABNORMAL HIGH (ref 1.7–7.7)
Neutrophils Relative %: 85 %
Platelet Count: 416 10*3/uL — ABNORMAL HIGH (ref 150–400)
RBC: 3.7 MIL/uL — ABNORMAL LOW (ref 4.22–5.81)
RDW: 13.2 % (ref 11.5–15.5)
WBC Count: 15 10*3/uL — ABNORMAL HIGH (ref 4.0–10.5)
nRBC: 0 % (ref 0.0–0.2)

## 2019-06-17 LAB — CBC
HCT: 30.3 % — ABNORMAL LOW (ref 39.0–52.0)
Hemoglobin: 10.3 g/dL — ABNORMAL LOW (ref 13.0–17.0)
MCH: 29.6 pg (ref 26.0–34.0)
MCHC: 34 g/dL (ref 30.0–36.0)
MCV: 87.1 fL (ref 80.0–100.0)
Platelets: 360 K/uL (ref 150–400)
RBC: 3.48 MIL/uL — ABNORMAL LOW (ref 4.22–5.81)
RDW: 13.3 % (ref 11.5–15.5)
WBC: 13.9 K/uL — ABNORMAL HIGH (ref 4.0–10.5)
nRBC: 0 % (ref 0.0–0.2)

## 2019-06-17 LAB — CMP (CANCER CENTER ONLY)
ALT: 14 U/L (ref 0–44)
AST: 24 U/L (ref 15–41)
Albumin: 3.7 g/dL (ref 3.5–5.0)
Alkaline Phosphatase: 86 U/L (ref 38–126)
Anion gap: 9 (ref 5–15)
BUN: 54 mg/dL — ABNORMAL HIGH (ref 6–20)
CO2: 27 mmol/L (ref 22–32)
Calcium: 14.9 mg/dL (ref 8.9–10.3)
Chloride: 82 mmol/L — ABNORMAL LOW (ref 98–111)
Creatinine: 1.66 mg/dL — ABNORMAL HIGH (ref 0.61–1.24)
GFR, Est AFR Am: 56 mL/min — ABNORMAL LOW (ref >60)
GFR, Estimated: 48 mL/min — ABNORMAL LOW (ref >60)
Glucose, Bld: 87 mg/dL (ref 70–99)
Potassium: 4.7 mmol/L (ref 3.5–5.1)
Sodium: 118 mmol/L — CL (ref 135–145)
Total Bilirubin: 0.5 mg/dL (ref 0.3–1.2)
Total Protein: 5.9 g/dL — ABNORMAL LOW (ref 6.5–8.1)

## 2019-06-17 LAB — CREATININE, SERUM
Creatinine, Ser: 1.58 mg/dL — ABNORMAL HIGH (ref 0.61–1.24)
GFR calc Af Amer: 59 mL/min — ABNORMAL LOW
GFR calc non Af Amer: 51 mL/min — ABNORMAL LOW

## 2019-06-17 MED ORDER — DRONABINOL 5 MG PO CAPS
5.0000 mg | ORAL_CAPSULE | Freq: Two times a day (BID) | ORAL | Status: DC
  Administered 2019-06-17 – 2019-06-21 (×6): 5 mg via ORAL
  Filled 2019-06-17 (×6): qty 1

## 2019-06-17 MED ORDER — SODIUM CHLORIDE 0.9 % IV SOLN: INTRAVENOUS | Status: DC

## 2019-06-17 MED ORDER — ENOXAPARIN SODIUM 40 MG/0.4ML ~~LOC~~ SOLN
40.0000 mg | SUBCUTANEOUS | Status: DC
  Administered 2019-06-17 – 2019-06-23 (×6): 40 mg via SUBCUTANEOUS
  Filled 2019-06-17 (×7): qty 0.4

## 2019-06-17 MED ORDER — MORPHINE SULFATE (PF) 4 MG/ML IV SOLN
INTRAVENOUS | Status: AC
  Filled 2019-06-17: qty 1

## 2019-06-17 MED ORDER — IOPAMIDOL (ISOVUE-300) INJECTION 61%
100.0000 mL | Freq: Once | INTRAVENOUS | Status: AC | PRN
  Administered 2019-06-17: 09:00:00 60 mL via INTRAVENOUS

## 2019-06-17 MED ORDER — SODIUM CHLORIDE 0.9 % IV SOLN
Freq: Once | INTRAVENOUS | Status: AC
  Filled 2019-06-17: qty 250

## 2019-06-17 MED ORDER — SODIUM CHLORIDE 0.9 % IV SOLN
20.0000 mg | Freq: Once | INTRAVENOUS | Status: AC
  Administered 2019-06-17: 20 mg via INTRAVENOUS
  Filled 2019-06-17: qty 2

## 2019-06-17 MED ORDER — LEVETIRACETAM 500 MG PO TABS
500.0000 mg | ORAL_TABLET | Freq: Two times a day (BID) | ORAL | Status: DC
  Administered 2019-06-17: 500 mg via ORAL
  Filled 2019-06-17: qty 1

## 2019-06-17 MED ORDER — ADULT MULTIVITAMIN W/MINERALS CH
1.0000 | ORAL_TABLET | Freq: Every day | ORAL | Status: DC
  Administered 2019-06-17: 1 via ORAL
  Filled 2019-06-17: qty 1

## 2019-06-17 MED ORDER — MORPHINE SULFATE 4 MG/ML IJ SOLN
2.0000 mg | Freq: Once | INTRAMUSCULAR | Status: AC
  Administered 2019-06-17: 2 mg via INTRAVENOUS
  Filled 2019-06-17: qty 1

## 2019-06-17 MED ORDER — FAMOTIDINE IN NACL 20-0.9 MG/50ML-% IV SOLN
20.0000 mg | Freq: Two times a day (BID) | INTRAVENOUS | Status: DC
  Administered 2019-06-17 – 2019-06-20 (×6): 20 mg via INTRAVENOUS
  Filled 2019-06-17 (×6): qty 50

## 2019-06-17 MED ORDER — DEXAMETHASONE SODIUM PHOSPHATE 4 MG/ML IJ SOLN: 20.0000 mg | INTRAMUSCULAR | Status: DC

## 2019-06-17 MED ORDER — ZOLEDRONIC ACID 4 MG/5ML IV CONC
3.0000 mg | Freq: Once | INTRAVENOUS | Status: AC
  Administered 2019-06-17: 3 mg via INTRAVENOUS
  Filled 2019-06-17: qty 3.75

## 2019-06-17 MED ORDER — CALCITONIN (SALMON) 200 UNIT/ML IJ SOLN
360.0000 [IU] | Freq: Two times a day (BID) | INTRAMUSCULAR | Status: AC
  Administered 2019-06-17 – 2019-06-18 (×2): 360 [IU] via SUBCUTANEOUS
  Filled 2019-06-17 (×2): qty 1.8

## 2019-06-17 MED ORDER — HYDROMORPHONE HCL 2 MG/ML IJ SOLN
2.0000 mg | INTRAMUSCULAR | Status: DC | PRN
  Administered 2019-06-18 (×2): 2 mg via INTRAVENOUS
  Filled 2019-06-17 (×2): qty 1

## 2019-06-17 MED ORDER — SODIUM CHLORIDE 0.9% FLUSH: 10.0000 mL | INTRAVENOUS | Status: DC | PRN

## 2019-06-17 MED ORDER — SODIUM CHLORIDE 0.9 % IV SOLN
20.0000 mg | Freq: Every day | INTRAVENOUS | Status: DC
  Administered 2019-06-18: 20 mg via INTRAVENOUS
  Filled 2019-06-17 (×2): qty 2

## 2019-06-17 NOTE — Progress Notes (Signed)
Verbal order from Dr. Marin Olp for 1 L NS at 999 ml/hr.

## 2019-06-17 NOTE — Progress Notes (Signed)
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 is a pleasant 49 yo caucasian gentleman here today with his wife for widespread metastatic disease felt to originate in the right kidney and includes retroperitoneal adenopathy, left neck base adenopathy, multiple lung nodules, multiple liver metastatic lesions and extensive osseous metastatic disease including direct osseous invasion by the retroperitoneal mass. He initially started with back in October 2020 that progressively worsened while seeing a chiropractor for what was though to be arthritis in his back. He had 16 treatments finishing in November.  He is no longer able to stand. He has a great deal of back and pelvic pain and is unable to stand. He notes numbness and tingling in his fingertips and legs. No swelling in his extremities at this time.   He is a little disoriented today and his wife is helping with his history.  He was taking Toradol and then switched to Hydrocodone which does not seem to be controlling his pain.  He has had some nausea with dry heaving off and on. He has had a few episodes of dizziness  He has SOB with any exertion.  He states that he does not have an appetite but states that he is doing his best to hydrate. He is unable to stand for weight.  He is constipated and has abdominal discomfort that waxes and wanes. His last BM was yesterday.  No fever, chills, cough, rash, chest pain, palpitations or changes in his bladder habits.   He has history of seizures but his wife states that he has not had one in 8 years on Keppra.  His father passed away from rectal cancer at age 46. He also had  a maternal aunt who passed away from lung cancer (smoker).  He quite smoking 1 1/2 ppd 1 month ago.  He stopped drinking a 6 pack of beer each night back in November.  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   ROS: All other 10 point review of systems is negative.   PAST MEDICAL HISTORY:   Past Medical History:  Diagnosis Date  . High blood pressure   . Seizures ([place])     ALLERGIES: No Known Allergies    MEDICATIONS:  Current Outpatient Medications on File Prior to Visit  Medication Sig Dispense Refill  . levETIRAcetam (KEPPRA) 500 MG tablet Take 1 tablet (500 mg total) by mouth 2 (two) times daily. 180 tablet 4  . loratadine (CLARITIN) 10 MG tablet Take 10 mg by mouth daily.    Marland Kitchen olmesartan (BENICAR) 40 MG tablet Take 20 mg by mouth daily.     No current facility-administered medications on file prior to visit.     PAST SURGICAL HISTORY Past Surgical History:  Procedure Laterality Date  . None      FAMILY HISTORY: Family History  Problem Relation Age of Onset  . Cancer - Colon Father     SOCIAL HISTORY:  reports that he has been smoking cigarettes. He has been smoking about 1.00 pack per day. He has never used smokeless tobacco. He reports current alcohol use  of about 1.0 standard drinks of alcohol per week. He reports that he does not use drugs.  PERFORMANCE STATUS: The patient's performance status is 4 - Bedbound  PHYSICAL EXAM: Most Recent Vital Signs: There were no vitals taken for this visit. There were no vitals taken for this visit.  General Appearance:    cooperative, no distress, appears stated age  Head:    Normocephalic, without obvious abnormality, atraumatic  Eyes:    PERRL, conjunctiva/corneas clear, EOM's intact, fundi    benign, both eyes             Throat:   Lips, mucosa, and tongue normal; teeth and gums normal  Neck:   Supple, symmetrical, trachea midline, no adenopathy;        thyroid:  No enlargement/tenderness/nodules; no carotid   bruit or JVD  Back:     Symmetric, no curvature, ROM normal, no CVA tenderness  Lungs:     Clear to auscultation bilaterally, respirations unlabored  Chest wall:    No tenderness or deformity  Heart:    Regular rate and rhythm, S1 and S2 normal, no murmur, rub   or gallop  Abdomen:     Soft, tender, bowel sounds active all four quadrants,    no masses or organomegaly palpated on exam        Extremities:   Unable to stand, numbness and tingling in legs and fingertips, atraumatic, no cyanosis or edema  Pulses:   2+ and symmetric all extremities  Skin:   Skin color pale, texture, turgor normal, no rashes or lesions  Lymph nodes:   Cervical, supraclavicular, and axillary nodes normal  Neurologic:  Patient weak, immobile, unable to stand    LABORATORY DATA:  No results found for this or any previous visit (from the past 48 hour(s)).    RADIOGRAPHY: CT CHEST W CONTRAST  Result Date: 06/17/2019 CLINICAL DATA:  Pt has hx of several months of debilitating lower back pain, pt is altered and confused at time, unable to recall personal hx, but does endorse pain, weight loss of more than 50lbs He is unable to perform daily tasks without assistance no prev sx, MRI @ [facility] in PACS showed abnormal EXAM: CT CHEST, ABDOMEN, AND PELVIS WITH CONTRAST TECHNIQUE: Multidetector CT imaging of the chest, abdomen and pelvis was performed following the standard protocol during bolus administration of intravenous contrast. CONTRAST:  70mL ISOVUE-300 IOPAMIDOL (ISOVUE-300) INJECTION 61% COMPARISON:  Lumbar MRI, 06/07/2019 FINDINGS: CT CHEST FINDINGS Cardiovascular: Heart is normal in size and configuration mild three-vessel coronary artery calcifications. No pericardial effusion. Great vessels are normal in caliber. No aortic atherosclerosis. Mediastinum/Nodes: Enlarged left neck base lymph nodes, largest measuring 2.9 cm in short axis. Thyroid is unremarkable.  No axillary masses or enlarged lymph nodes. No mediastinal or hilar masses. Mildly enlarged left hilar lymph node 1.2 cm in short axis. Trachea and esophagus are unremarkable. Lungs/Pleura: Numerous pulmonary nodules bilaterally. Several dominant nodules or measure. Largest left nodule, posteromedial left lower lobe, image 111, series 5, 1.4 cm. Largest right lung nodule, right lower lobe, image 109, 1.6 x 1.1 cm. No lung consolidation or pulmonary edema. Trace left pleural effusion. No pneumothorax. CT ABDOMEN PELVIS FINDINGS Hepatobiliary: Multiple liver masses. Largest mass arising from the inferior right lobe, segment 6, 2.3 cm. Largest lesion from the left lobe, segment 4B, 2.3 cm Liver normal in overall size and attenuation. Gallbladder is unremarkable. No bile duct dilation. Pancreas: Unremarkable. No pancreatic ductal dilatation or surrounding inflammatory changes. Spleen: Normal in  size without focal abnormality. Adrenals/Urinary Tract: Heterogeneous mass from the upper pole of the right kidney, measuring approximately 7.7 x 6.3 x 7.1 cm. This mass is contiguous with a larger hypoattenuating infiltrative mass at surrounds and significantly narrows and encases the inferior vena cava, encases the abdominal aorta, celiac axis, superior mesenteric artery and renal arteries and extends to the lower thoracic and upper lumbar spine. The mass abuts and thins the portal vein without evidence of invasion. This contiguous mass along with the renal mass measures a combined 18 x 11 x 12 cm. The mass contacts the medial margin of the left kidney. It broadly abuts the posterior aspect of the liver. It involves the vertebra eroding portions of the T12 through L3 vertebral bodies. Mass obscures the right adrenal gland. Mass involves the medial limb of the left adrenal gland. Lateral limb is unremarkable. Left kidney is normal in size and attenuation. Mild left hydronephrosis. No left renal mass or stone. Ureters normal in  course and in caliber. Bladder is unremarkable. Stomach/Bowel: Normal stomach. Small bowel and colon are normal in caliber. No wall thickening. No inflammation. No involvement by the above described mass. Normal appendix visualized. Vascular/Lymphatic: Mildly enlarged Peri celiac lymph node measuring 1.4 cm short axis separate from the large mass. Left periaortic lymph node at the level of the lower pole the left kidney, 1.1 cm in short axis. Mild aortic atherosclerosis. Portal vein is narrowed by the mass but not occluded. No convincing invasion. Reproductive: Unremarkable. Other: Small amount ascites in the posterior pelvic recess. MUSCULOSKELETAL FINDINGS 2 widespread metastatic skeletal disease reflected by numerous lucent bone lesions. Lesions are visualized throughout the spine, pelvis, sternum, left scapula and several ribs. There are expansile lesions from the right anterior fourth rib and left anterior second rib. There are multiple pathologic compression fractures including T2, T4, T5, T6, T8, T12, L1, L2 and L3. IMPRESSION: 1. Large retroperitoneal mass with extensive metastatic disease. This mass is most likely a renal cell carcinoma arising from the upper pole the right kidney, although the mass could potentially be primary with secondary involvement of the kidney. Mass surrounds the aorta, its branch vessels, the inferior vena cava and abuts and thins the portal vein. Overall dimensions of the retroperitoneal mass are 1.8 x 1.2 x 1.1 cm. 2. Metastatic disease includes retroperitoneal adenopathy, left neck base adenopathy, multiple lung nodules, multiple liver metastatic lesions and extensive osseous metastatic disease including direct osseous invasion by the retroperitoneal mass. Electronically Signed   By: Lajean Manes M.D.   On: 06/17/2019 13:42   CT ABDOMEN PELVIS W CONTRAST  Result Date: 06/17/2019 CLINICAL DATA:  Pt has hx of several months of debilitating lower back pain, pt is altered and  confused at time, unable to recall personal hx, but does endorse pain, weight loss of more than 50lbs He is unable to perform daily tasks without assistance no prev sx, MRI @ [facility] in PACS showed abnormal EXAM: CT CHEST, ABDOMEN, AND PELVIS WITH CONTRAST TECHNIQUE: Multidetector CT imaging of the chest, abdomen and pelvis was performed following the standard protocol during bolus administration of intravenous contrast. CONTRAST:  75mL ISOVUE-300 IOPAMIDOL (ISOVUE-300) INJECTION 61% COMPARISON:  Lumbar MRI, 06/07/2019 FINDINGS: CT CHEST FINDINGS Cardiovascular: Heart is normal in size and configuration mild three-vessel coronary artery calcifications. No pericardial effusion. Great vessels are normal in caliber. No aortic atherosclerosis. Mediastinum/Nodes: Enlarged left neck base lymph nodes, largest measuring 2.9 cm in short axis. Thyroid is unremarkable. No axillary masses or enlarged lymph nodes.  No mediastinal or hilar masses. Mildly enlarged left hilar lymph node 1.2 cm in short axis. Trachea and esophagus are unremarkable. Lungs/Pleura: Numerous pulmonary nodules bilaterally. Several dominant nodules or measure. Largest left nodule, posteromedial left lower lobe, image 111, series 5, 1.4 cm. Largest right lung nodule, right lower lobe, image 109, 1.6 x 1.1 cm. No lung consolidation or pulmonary edema. Trace left pleural effusion. No pneumothorax. CT ABDOMEN PELVIS FINDINGS Hepatobiliary: Multiple liver masses. Largest mass arising from the inferior right lobe, segment 6, 2.3 cm. Largest lesion from the left lobe, segment 4B, 2.3 cm Liver normal in overall size and attenuation. Gallbladder is unremarkable. No bile duct dilation. Pancreas: Unremarkable. No pancreatic ductal dilatation or surrounding inflammatory changes. Spleen: Normal in size without focal abnormality. Adrenals/Urinary Tract: Heterogeneous mass from the upper pole of the right kidney, measuring approximately 7.7 x 6.3 x 7.1 cm. This mass is  contiguous with a larger hypoattenuating infiltrative mass at surrounds and significantly narrows and encases the inferior vena cava, encases the abdominal aorta, celiac axis, superior mesenteric artery and renal arteries and extends to the lower thoracic and upper lumbar spine. The mass abuts and thins the portal vein without evidence of invasion. This contiguous mass along with the renal mass measures a combined 18 x 11 x 12 cm. The mass contacts the medial margin of the left kidney. It broadly abuts the posterior aspect of the liver. It involves the vertebra eroding portions of the T12 through L3 vertebral bodies. Mass obscures the right adrenal gland. Mass involves the medial limb of the left adrenal gland. Lateral limb is unremarkable. Left kidney is normal in size and attenuation. Mild left hydronephrosis. No left renal mass or stone. Ureters normal in course and in caliber. Bladder is unremarkable. Stomach/Bowel: Normal stomach. Small bowel and colon are normal in caliber. No wall thickening. No inflammation. No involvement by the above described mass. Normal appendix visualized. Vascular/Lymphatic: Mildly enlarged Peri celiac lymph node measuring 1.4 cm short axis separate from the large mass. Left periaortic lymph node at the level of the lower pole the left kidney, 1.1 cm in short axis. Mild aortic atherosclerosis. Portal vein is narrowed by the mass but not occluded. No convincing invasion. Reproductive: Unremarkable. Other: Small amount ascites in the posterior pelvic recess. MUSCULOSKELETAL FINDINGS 2 widespread metastatic skeletal disease reflected by numerous lucent bone lesions. Lesions are visualized throughout the spine, pelvis, sternum, left scapula and several ribs. There are expansile lesions from the right anterior fourth rib and left anterior second rib. There are multiple pathologic compression fractures including T2, T4, T5, T6, T8, T12, L1, L2 and L3. IMPRESSION: 1. Large retroperitoneal  mass with extensive metastatic disease. This mass is most likely a renal cell carcinoma arising from the upper pole the right kidney, although the mass could potentially be primary with secondary involvement of the kidney. Mass surrounds the aorta, its branch vessels, the inferior vena cava and abuts and thins the portal vein. Overall dimensions of the retroperitoneal mass are 1.8 x 1.2 x 1.1 cm. 2. Metastatic disease includes retroperitoneal adenopathy, left neck base adenopathy, multiple lung nodules, multiple liver metastatic lesions and extensive osseous metastatic disease including direct osseous invasion by the retroperitoneal mass. Electronically Signed   By: Lajean Manes M.D.   On: 06/17/2019 13:42       PATHOLOGY: None  ASSESSMENT/PLAN: Mr. Teo is a pleasant 49 yo caucasian gentleman here today with his wife for widespread metastatic disease of the chest, abdomen and pelvis felt to  originate in the right kidney.   His performance status at this time is quite poor. He is weak and unable to stand.  He did receive IV decadron and morphine for pain while here in the office.  Lab work shows hypercalcemia and severe dehydration.  We are direct admitting him directly to [facility] at this time. EMS is on their way.  He is admitted to Dr. Marin Olp. Both he and K. Curcio, NP will follow-up in hospital.   All questions were answered and both he and his wife are in agreement with the plan.   The patient was discussed with and also seen by Dr. Marin Olp and he is in agreement with the aforementioned.   Laverna Peace, NP    Addendum: I saw and examined Mr. Snouffer with Judson Roch.  This is a very serious situation.  He has a large retroperitoneal mass.  It is hard to say if this is emanating from his right kidney.  It is clearly not resectable.  He has pulmonary metastasis on the CT scan.  Looks like he also has spinal fractures.  He clearly is going had to be admitted.  There is no way that  he will be able to make it as an outpatient.  His poor wife is just not able to care for all of his needs right now.  He is markedly hypercalcemic.  He is dehydrated.  He has renal insufficiency.  We will give him some IV fluids in the office.  He will get some IV pain medications in the office.  I will give him some Decadron.  We will have to see about getting a biopsy of this large abdominal mass.  This should be able to tell us what is going on.  Unfortunately, I think the only treatable malignancy that we will have is a lymphoma.  If this is renal cell carcinoma or if this is a retroperitoneal sarcoma, I just do not think that we will be able to make much of an impact with therapy given the bulk of the his disease.  Hopefully, if we can get his hypercalcemia improved, he will be able to eat better, have a little bit of better mentation and also start going to the bathroom.  I have talked to his wife.  She is very nice.  She realizes the seriousness of the problem that we have.  I told her that it is hard to say whether or not we can treat this.  We can please try to palliate him and try to get him more comfortable.  We might be able to consider vertebroplasty for his spine.  This may help with his pain.  Radiation therapy also might be a consideration.  I think if this is a high-grade lymphoma, then this would be able to be treated relatively easily with chemotherapy.  Again, we will have to admit Mr. Akridge.  I suspect he probably will be in the hospital for at least a week.  We spent over an hour with him today.  Again this was a very serious problem.  He was in a lot worse shape than I had expected.  Lattie Haw, MD

## 2019-06-17 NOTE — Patient Instructions (Signed)

## 2019-06-17 NOTE — H&P (Signed)
Hematology/Oncology Consultation   Name: Mark Tanner      MRN: [id number]    Location: 1606/1606-01  Date: 06/17/2019 Time:4:37 PM   REFERRING PHYSICIAN: Phylliss Bob, MD  REASON FOR CONSULT:    DIAGNOSIS: Wide spread metastatic disease throughout the chest, abdomen and pelvis felt to be due to renal cell carcinoma (right)  HISTORY OF PRESENT ILLNESS: Mark Tanner is a pleasant 49 yo caucasian gentleman here today with his wife for widespread metastatic disease felt to originate in the right kidney and includes retroperitoneal adenopathy, left neck base adenopathy, multiple lung nodules, multiple liver metastatic lesions and extensive osseous metastatic disease including direct osseous invasion by the retroperitoneal mass. He initially started with back in October 2020 that progressively worsened while seeing a chiropractor for what was though to be arthritis in his back. He had 16 treatments finishing in November.  He is no longer able to stand. He has a great deal of back and pelvic pain and is unable to stand. He notes numbness and tingling in his fingertips and legs. No swelling in his extremities at this time.   He is a little disoriented today and his wife is helping with his history.  He was taking Toradol and then switched to Hydrocodone which does not seem to be controlling his pain.  He has had some nausea with dry heaving off and on. He has had a few episodes of dizziness  He has SOB with any exertion.  He states that he does not have an appetite but states that he is doing his best to hydrate. He is unable to stand for weight.  He is constipated and has abdominal discomfort that waxes and wanes. His last BM was yesterday.  No fever, chills, cough, rash, chest pain, palpitations or changes in his bladder habits.   He has history of seizures but his wife states that he has not had one in 8 years on Keppra.  His father passed away from rectal cancer at age 37. He also had a maternal  aunt who passed away from lung cancer (smoker).  He quite smoking 1 1/2 ppd 1 month ago.  He stopped drinking a 6 pack of beer each night back in November.  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   ROS: All other 10 point review of systems is negative.   PAST MEDICAL HISTORY:   Past Medical History:  Diagnosis Date  . High blood pressure   . Seizures ([place])     ALLERGIES: No Known Allergies    MEDICATIONS:  No current facility-administered medications on file prior to encounter.   Current Outpatient Medications on File Prior to Encounter  Medication Sig Dispense Refill  . levETIRAcetam (KEPPRA) 500 MG tablet Take 1 tablet (500 mg total) by mouth 2 (two) times daily. 180 tablet 4  . loratadine (CLARITIN) 10 MG tablet Take 10 mg by mouth daily.    Marland Kitchen olmesartan (BENICAR) 40 MG tablet Take 20 mg by mouth daily.       PAST SURGICAL HISTORY Past Surgical History:  Procedure Laterality Date  . None      FAMILY HISTORY: Family History  Problem Relation Age of Onset  . Cancer - Colon Father     SOCIAL HISTORY:  reports that he has been smoking cigarettes. He has been smoking about 1.00 pack per day. He has never used smokeless tobacco. He reports current alcohol use of about 1.0  standard drinks of alcohol per week. He reports that he does not use drugs.  PERFORMANCE STATUS: The patient's performance status is 4 - Bedbound  PHYSICAL EXAM: Most Recent Vital Signs: Blood pressure 133/81, pulse 83, temperature 98.3 F (36.8 C), temperature source Oral, resp. rate 16, SpO2 98 %. BP 133/81 (BP Location: Left Arm)   Pulse 83   Temp 98.3 F (36.8 C) (Oral)   Resp 16   SpO2 98%   General Appearance:    cooperative, no distress, appears stated age  Head:    Normocephalic, without obvious abnormality, atraumatic  Eyes:    PERRL, conjunctiva/corneas clear, EOM's intact, fundi    benign, both eyes              Throat:   Lips, mucosa, and tongue normal; teeth and gums normal  Neck:   Supple, symmetrical, trachea midline, no adenopathy;       thyroid:  No enlargement/tenderness/nodules; no carotid   bruit or JVD  Back:     Symmetric, no curvature, ROM normal, no CVA tenderness  Lungs:     Clear to auscultation bilaterally, respirations unlabored  Chest wall:    No tenderness or deformity  Heart:    Regular rate and rhythm, S1 and S2 normal, no murmur, rub   or gallop  Abdomen:     Soft, tender, bowel sounds active all four quadrants,    no masses or organomegaly palpated on exam        Extremities:   Unable to stand, numbness and tingling in legs and fingertips, atraumatic, no cyanosis or edema  Pulses:   2+ and symmetric all extremities  Skin:   Skin color pale, texture, turgor normal, no rashes or lesions  Lymph nodes:   Cervical, supraclavicular, and axillary nodes normal  Neurologic:  Patient weak, immobile, unable to stand    LABORATORY DATA:  Results for orders placed or performed in visit on 06/17/19 (from the past 48 hour(s))  CBC with Differential ([place] Only)     Status: Abnormal   Collection Time: 06/17/19  2:09 PM  Result Value Ref Range   WBC Count 15.0 (H) 4.0 - 10.5 K/uL   RBC 3.70 (L) 4.22 - 5.81 MIL/uL   Hemoglobin 10.8 (L) 13.0 - 17.0 g/dL   HCT 30.9 (L) 39.0 - 52.0 %   MCV 83.5 80.0 - 100.0 fL   MCH 29.2 26.0 - 34.0 pg   MCHC 35.0 30.0 - 36.0 g/dL   RDW 13.2 11.5 - 15.5 %   Platelet Count 416 (H) 150 - 400 K/uL   nRBC 0.0 0.0 - 0.2 %   Neutrophils Relative % 85 %   Neutro Abs 12.7 (H) 1.7 - 7.7 K/uL   Lymphocytes Relative 7 %   Lymphs Abs 1.1 0.7 - 4.0 K/uL   Monocytes Relative 7 %   Monocytes Absolute 1.0 0.1 - 1.0 K/uL   Eosinophils Relative 0 %   Eosinophils Absolute 0.1 0.0 - 0.5 K/uL   Basophils Relative 0 %   Basophils Absolute 0.0 0.0 - 0.1 K/uL   Immature Granulocytes 1 %   Abs Immature Granulocytes 0.19 (H) 0.00 - 0.07 K/uL    Comment:  Performed at [facility] at [facility], [address] [place], [place] [address]  CMP ([place] only)     Status: Abnormal   Collection Time: 06/17/19  2:09 PM  Result Value Ref Range   Sodium 118 (LL) 135 -  145 mmol/L    Comment: CRITICAL RESULT CALLED TO, READ BACK BY AND VERIFIED WITH: Arnell Sieving, RN 1502    Potassium 4.7 3.5 - 5.1 mmol/L   Chloride 82 (L) 98 - 111 mmol/L   CO2 27 22 - 32 mmol/L   Glucose, Bld 87 70 - 99 mg/dL   BUN 54 (H) 6 - 20 mg/dL   Creatinine 1.66 (H) 0.61 - 1.24 mg/dL   Calcium 14.9 (HH) 8.9 - 10.3 mg/dL    Comment: CRITICAL RESULT CALLED TO, READ BACK BY AND VERIFIED WITH: Arnell Sieving, RN [phone]    Total Protein 5.9 (L) 6.5 - 8.1 g/dL   Albumin 3.7 3.5 - 5.0 g/dL   AST 24 15 - 41 U/L   ALT 14 0 - 44 U/L   Alkaline Phosphatase 86 38 - 126 U/L   Total Bilirubin 0.5 0.3 - 1.2 mg/dL   GFR, Est Non Af Am 48 (L) >60 mL/min   GFR, Est AFR Am 56 (L) >60 mL/min   Anion gap 9 5 - 15    Comment: Performed at [facility], [place] [address], [place], [place] [address]      RADIOGRAPHY: CT CHEST W CONTRAST  Result Date: 06/17/2019 CLINICAL DATA:  Pt has hx of several months of debilitating lower back pain, pt is altered and confused at time, unable to recall personal hx, but does endorse pain, weight loss of more than 50lbs He is unable to perform daily tasks without assistance no prev sx, MRI @ [facility]OS in PACS showed abnormal EXAM: CT CHEST, ABDOMEN, AND PELVIS WITH CONTRAST TECHNIQUE: Multidetector CT imaging of the chest, abdomen and pelvis was performed following the standard protocol during bolus administration of intravenous contrast. CONTRAST:  35mL ISOVUE-300 IOPAMIDOL (ISOVUE-300) INJECTION 61% COMPARISON:  Lumbar MRI, 06/07/2019 FINDINGS: CT CHEST FINDINGS Cardiovascular: Heart is normal in size and configuration mild three-vessel coronary artery calcifications. No pericardial effusion. Great vessels are normal in  caliber. No aortic atherosclerosis. Mediastinum/Nodes: Enlarged left neck base lymph nodes, largest measuring 2.9 cm in short axis. Thyroid is unremarkable. No axillary masses or enlarged lymph nodes. No mediastinal or hilar masses. Mildly enlarged left hilar lymph node 1.2 cm in short axis. Trachea and esophagus are unremarkable. Lungs/Pleura: Numerous pulmonary nodules bilaterally. Several dominant nodules or measure. Largest left nodule, posteromedial left lower lobe, image 111, series 5, 1.4 cm. Largest right lung nodule, right lower lobe, image 109, 1.6 x 1.1 cm. No lung consolidation or pulmonary edema. Trace left pleural effusion. No pneumothorax. CT ABDOMEN PELVIS FINDINGS Hepatobiliary: Multiple liver masses. Largest mass arising from the inferior right lobe, segment 6, 2.3 cm. Largest lesion from the left lobe, segment 4B, 2.3 cm Liver normal in overall size and attenuation. Gallbladder is unremarkable. No bile duct dilation. Pancreas: Unremarkable. No pancreatic ductal dilatation or surrounding inflammatory changes. Spleen: Normal in size without focal abnormality. Adrenals/Urinary Tract: Heterogeneous mass from the upper pole of the right kidney, measuring approximately 7.7 x 6.3 x 7.1 cm. This mass is contiguous with a larger hypoattenuating infiltrative mass at surrounds and significantly narrows and encases the inferior vena cava, encases the abdominal aorta, celiac axis, superior mesenteric artery and renal arteries and extends to the lower thoracic and upper lumbar spine. The mass abuts and thins the portal vein without evidence of invasion. This contiguous mass along with the renal mass measures a combined 18 x 11 x 12 cm. The mass contacts the medial margin of the left kidney. It broadly  abuts the posterior aspect of the liver. It involves the vertebra eroding portions of the T12 through L3 vertebral bodies. Mass obscures the right adrenal gland. Mass involves the medial limb of the left adrenal  gland. Lateral limb is unremarkable. Left kidney is normal in size and attenuation. Mild left hydronephrosis. No left renal mass or stone. Ureters normal in course and in caliber. Bladder is unremarkable. Stomach/Bowel: Normal stomach. Small bowel and colon are normal in caliber. No wall thickening. No inflammation. No involvement by the above described mass. Normal appendix visualized. Vascular/Lymphatic: Mildly enlarged Peri celiac lymph node measuring 1.4 cm short axis separate from the large mass. Left periaortic lymph node at the level of the lower pole the left kidney, 1.1 cm in short axis. Mild aortic atherosclerosis. Portal vein is narrowed by the mass but not occluded. No convincing invasion. Reproductive: Unremarkable. Other: Small amount ascites in the posterior pelvic recess. MUSCULOSKELETAL FINDINGS 2 widespread metastatic skeletal disease reflected by numerous lucent bone lesions. Lesions are visualized throughout the spine, pelvis, sternum, left scapula and several ribs. There are expansile lesions from the right anterior fourth rib and left anterior second rib. There are multiple pathologic compression fractures including T2, T4, T5, T6, T8, T12, L1, L2 and L3. IMPRESSION: 1. Large retroperitoneal mass with extensive metastatic disease. This mass is most likely a renal cell carcinoma arising from the upper pole the right kidney, although the mass could potentially be primary with secondary involvement of the kidney. Mass surrounds the aorta, its branch vessels, the inferior vena cava and abuts and thins the portal vein. Overall dimensions of the retroperitoneal mass are 1.8 x 1.2 x 1.1 cm. 2. Metastatic disease includes retroperitoneal adenopathy, left neck base adenopathy, multiple lung nodules, multiple liver metastatic lesions and extensive osseous metastatic disease including direct osseous invasion by the retroperitoneal mass. Electronically Signed   By: Lajean Manes M.D.   On: 06/17/2019 13:42    CT ABDOMEN PELVIS W CONTRAST  Result Date: 06/17/2019 CLINICAL DATA:  Pt has hx of several months of debilitating lower back pain, pt is altered and confused at time, unable to recall personal hx, but does endorse pain, weight loss of more than 50lbs He is unable to perform daily tasks without assistance no prev sx, MRI @ [facility] in PACS showed abnormal EXAM: CT CHEST, ABDOMEN, AND PELVIS WITH CONTRAST TECHNIQUE: Multidetector CT imaging of the chest, abdomen and pelvis was performed following the standard protocol during bolus administration of intravenous contrast. CONTRAST:  24mL ISOVUE-300 IOPAMIDOL (ISOVUE-300) INJECTION 61% COMPARISON:  Lumbar MRI, 06/07/2019 FINDINGS: CT CHEST FINDINGS Cardiovascular: Heart is normal in size and configuration mild three-vessel coronary artery calcifications. No pericardial effusion. Great vessels are normal in caliber. No aortic atherosclerosis. Mediastinum/Nodes: Enlarged left neck base lymph nodes, largest measuring 2.9 cm in short axis. Thyroid is unremarkable. No axillary masses or enlarged lymph nodes. No mediastinal or hilar masses. Mildly enlarged left hilar lymph node 1.2 cm in short axis. Trachea and esophagus are unremarkable. Lungs/Pleura: Numerous pulmonary nodules bilaterally. Several dominant nodules or measure. Largest left nodule, posteromedial left lower lobe, image 111, series 5, 1.4 cm. Largest right lung nodule, right lower lobe, image 109, 1.6 x 1.1 cm. No lung consolidation or pulmonary edema. Trace left pleural effusion. No pneumothorax. CT ABDOMEN PELVIS FINDINGS Hepatobiliary: Multiple liver masses. Largest mass arising from the inferior right lobe, segment 6, 2.3 cm. Largest lesion from the left lobe, segment 4B, 2.3 cm Liver normal in overall size and attenuation. Gallbladder is  unremarkable. No bile duct dilation. Pancreas: Unremarkable. No pancreatic ductal dilatation or surrounding inflammatory changes. Spleen: Normal in size without focal  abnormality. Adrenals/Urinary Tract: Heterogeneous mass from the upper pole of the right kidney, measuring approximately 7.7 x 6.3 x 7.1 cm. This mass is contiguous with a larger hypoattenuating infiltrative mass at surrounds and significantly narrows and encases the inferior vena cava, encases the abdominal aorta, celiac axis, superior mesenteric artery and renal arteries and extends to the lower thoracic and upper lumbar spine. The mass abuts and thins the portal vein without evidence of invasion. This contiguous mass along with the renal mass measures a combined 18 x 11 x 12 cm. The mass contacts the medial margin of the left kidney. It broadly abuts the posterior aspect of the liver. It involves the vertebra eroding portions of the T12 through L3 vertebral bodies. Mass obscures the right adrenal gland. Mass involves the medial limb of the left adrenal gland. Lateral limb is unremarkable. Left kidney is normal in size and attenuation. Mild left hydronephrosis. No left renal mass or stone. Ureters normal in course and in caliber. Bladder is unremarkable. Stomach/Bowel: Normal stomach. Small bowel and colon are normal in caliber. No wall thickening. No inflammation. No involvement by the above described mass. Normal appendix visualized. Vascular/Lymphatic: Mildly enlarged Peri celiac lymph node measuring 1.4 cm short axis separate from the large mass. Left periaortic lymph node at the level of the lower pole the left kidney, 1.1 cm in short axis. Mild aortic atherosclerosis. Portal vein is narrowed by the mass but not occluded. No convincing invasion. Reproductive: Unremarkable. Other: Small amount ascites in the posterior pelvic recess. MUSCULOSKELETAL FINDINGS 2 widespread metastatic skeletal disease reflected by numerous lucent bone lesions. Lesions are visualized throughout the spine, pelvis, sternum, left scapula and several ribs. There are expansile lesions from the right anterior fourth rib and left anterior  second rib. There are multiple pathologic compression fractures including T2, T4, T5, T6, T8, T12, L1, L2 and L3. IMPRESSION: 1. Large retroperitoneal mass with extensive metastatic disease. This mass is most likely a renal cell carcinoma arising from the upper pole the right kidney, although the mass could potentially be primary with secondary involvement of the kidney. Mass surrounds the aorta, its branch vessels, the inferior vena cava and abuts and thins the portal vein. Overall dimensions of the retroperitoneal mass are 1.8 x 1.2 x 1.1 cm. 2. Metastatic disease includes retroperitoneal adenopathy, left neck base adenopathy, multiple lung nodules, multiple liver metastatic lesions and extensive osseous metastatic disease including direct osseous invasion by the retroperitoneal mass. Electronically Signed   By: Lajean Manes M.D.   On: 06/17/2019 13:42       PATHOLOGY: None  ASSESSMENT/PLAN: Mark Tanner is a pleasant 49 yo caucasian gentleman here today with his wife for widespread metastatic disease of the chest, abdomen and pelvis felt to originate in the right kidney.   His performance status at this time is quite poor. He is weak and unable to stand.  He did receive IV decadron and morphine for pain while here in the office.  Lab work shows hypercalcemia and severe dehydration.  We are direct admitting him directly to [facility] at this time. EMS is on their way.  He is admitted to Dr. Marin Olp. Both he and K. Curcio, NP will follow-up in hospital.   All questions were answered and both he and his wife are in agreement with the plan.   The patient was discussed with and also  seen by Dr. Marin Olp and he is in agreement with the aforementioned.   Volanda Napoleon, NP    Addendum: I saw and examined Mark Tanner with Judson Roch.  This is a very serious situation.  He has a large retroperitoneal mass.  It is hard to say if this is emanating from his right kidney.  It is clearly not resectable.  He  has pulmonary metastasis on the CT scan.  Looks like he also has spinal fractures.  He clearly is going had to be admitted.  There is no way that he will be able to make it as an outpatient.  His poor wife is just not able to care for all of his needs right now.  He is markedly hypercalcemic.  He is dehydrated.  He has renal insufficiency.  We will give him some IV fluids in the office.  He will get some IV pain medications in the office.  I will give him some Decadron.  We will have to see about getting a biopsy of this large abdominal mass.  This should be able to tell us what is going on.  Unfortunately, I think the only treatable malignancy that we will have is a lymphoma.  If this is renal cell carcinoma or if this is a retroperitoneal sarcoma, I just do not think that we will be able to make much of an impact with therapy given the bulk of the his disease.  Hopefully, if we can get his hypercalcemia improved, he will be able to eat better, have a little bit of better mentation and also start going to the bathroom.  I have talked to his wife.  She is very nice.  She realizes the seriousness of the problem that we have.  I told her that it is hard to say whether or not we can treat this.  We can please try to palliate him and try to get him more comfortable.  We might be able to consider vertebroplasty for his spine.  This may help with his pain.  Radiation therapy also might be a consideration.  I think if this is a high-grade lymphoma, then this would be able to be treated relatively easily with chemotherapy.  Again, we will have to admit Mark Tanner.  I suspect he probably will be in the hospital for at least a week.  We spent over an hour with him today.  Again this was a very serious problem.  He was in a lot worse shape than I had expected.  Lattie Haw, MD

## 2019-06-17 NOTE — Telephone Encounter (Signed)
Dr. Marin Olp notified of sodium-118 and calcium-14.9.  No new orders received at this time.  Pt is being directly admitted to [facility]4 at [facility] via [place].

## 2019-06-17 NOTE — Progress Notes (Signed)
Initial RN Navigator Patient Visit  Name: Mark Tanner Date of Referral : 06/11/19  Diagnosis: Unknown Primary  Met with patient prior to their visit with MD. [name] patient "Your Patient Navigator" handout which explains my role, areas in which I am able to help, and all the contact information for myself and the office. Also gave patient MD and Navigator business card. Reviewed with patient the general overview of expected course after initial diagnosis and time frame for all steps to be completed.  New patient packet given to patient which includes: orientation to office and staff; campus directory; education on My Chart and Advance Directives; and patient centered education on cancer.   Patient completed visit with Dr. Marin Olp  Revisited with patient after MD visit. Patient will need  Patient is unstable and requires hospital admission. Called bed placement for direct admission under Dr Marin Olp. Report called to Laural Benes RN.   Wife has my card and knows she can contact me. Will readdress patient needs once he is discharged.

## 2019-06-18 ENCOUNTER — Inpatient Hospital Stay (HOSPITAL_COMMUNITY): Payer: No Typology Code available for payment source

## 2019-06-18 ENCOUNTER — Telehealth: Payer: Self-pay | Admitting: Family

## 2019-06-18 ENCOUNTER — Encounter (HOSPITAL_COMMUNITY): Payer: Self-pay | Admitting: Hematology & Oncology

## 2019-06-18 DIAGNOSIS — C7800 Secondary malignant neoplasm of unspecified lung: Secondary | ICD-10-CM

## 2019-06-18 DIAGNOSIS — C787 Secondary malignant neoplasm of liver and intrahepatic bile duct: Secondary | ICD-10-CM

## 2019-06-18 DIAGNOSIS — C641 Malignant neoplasm of right kidney, except renal pelvis: Secondary | ICD-10-CM

## 2019-06-18 DIAGNOSIS — C7951 Secondary malignant neoplasm of bone: Secondary | ICD-10-CM

## 2019-06-18 DIAGNOSIS — R1900 Intra-abdominal and pelvic swelling, mass and lump, unspecified site: Secondary | ICD-10-CM

## 2019-06-18 LAB — COMPREHENSIVE METABOLIC PANEL
ALT: 19 U/L (ref 0–44)
AST: 23 U/L (ref 15–41)
Albumin: 3.3 g/dL — ABNORMAL LOW (ref 3.5–5.0)
Alkaline Phosphatase: 85 U/L (ref 38–126)
Anion gap: 11 (ref 5–15)
BUN: 57 mg/dL — ABNORMAL HIGH (ref 6–20)
CO2: 26 mmol/L (ref 22–32)
Calcium: 12.6 mg/dL — ABNORMAL HIGH (ref 8.9–10.3)
Chloride: 83 mmol/L — ABNORMAL LOW (ref 98–111)
Creatinine, Ser: 1.58 mg/dL — ABNORMAL HIGH (ref 0.61–1.24)
GFR calc Af Amer: 59 mL/min — ABNORMAL LOW (ref >60)
GFR calc non Af Amer: 51 mL/min — ABNORMAL LOW (ref >60)
Glucose, Bld: 109 mg/dL — ABNORMAL HIGH (ref 70–99)
Potassium: 4.7 mmol/L (ref 3.5–5.1)
Sodium: 120 mmol/L — ABNORMAL LOW (ref 135–145)
Total Bilirubin: 0.8 mg/dL (ref 0.3–1.2)
Total Protein: 6.2 g/dL — ABNORMAL LOW (ref 6.5–8.1)

## 2019-06-18 LAB — CBC
HCT: 31.9 % — ABNORMAL LOW (ref 39.0–52.0)
Hemoglobin: 11.2 g/dL — ABNORMAL LOW (ref 13.0–17.0)
MCH: 30.4 pg (ref 26.0–34.0)
MCHC: 35.1 g/dL (ref 30.0–36.0)
MCV: 86.4 fL (ref 80.0–100.0)
Platelets: 409 10*3/uL — ABNORMAL HIGH (ref 150–400)
RBC: 3.69 MIL/uL — ABNORMAL LOW (ref 4.22–5.81)
RDW: 13.5 % (ref 11.5–15.5)
WBC: 13.3 10*3/uL — ABNORMAL HIGH (ref 4.0–10.5)
nRBC: 0 % (ref 0.0–0.2)

## 2019-06-18 LAB — SARS CORONAVIRUS 2 (TAT 6-24 HRS): SARS Coronavirus 2: NEGATIVE

## 2019-06-18 LAB — IRON AND TIBC
Iron: 45 ug/dL (ref 42–163)
Saturation Ratios: 22 % (ref 20–55)
TIBC: 206 ug/dL (ref 202–409)
UIBC: 162 ug/dL (ref 117–376)

## 2019-06-18 LAB — FERRITIN: Ferritin: 2310 ng/mL — ABNORMAL HIGH (ref 24–336)

## 2019-06-18 LAB — APTT: aPTT: 32 seconds (ref 24–36)

## 2019-06-18 LAB — LACTATE DEHYDROGENASE: LDH: 627 U/L — ABNORMAL HIGH (ref 98–192)

## 2019-06-18 LAB — PROTIME-INR
INR: 1.1 (ref 0.8–1.2)
Prothrombin Time: 13.7 seconds (ref 11.4–15.2)

## 2019-06-18 MED ORDER — HYDROMORPHONE HCL 2 MG/ML IJ SOLN
1.0000 mg | INTRAMUSCULAR | Status: DC | PRN
  Administered 2019-06-18 – 2019-06-21 (×12): 1 mg via INTRAVENOUS
  Filled 2019-06-18 (×12): qty 1

## 2019-06-18 MED ORDER — FENTANYL CITRATE (PF) 100 MCG/2ML IJ SOLN
INTRAMUSCULAR | Status: AC
  Filled 2019-06-18: qty 2

## 2019-06-18 MED ORDER — MIDAZOLAM HCL 2 MG/2ML IJ SOLN
INTRAMUSCULAR | Status: AC | PRN
  Administered 2019-06-18 (×2): 1 mg via INTRAVENOUS

## 2019-06-18 MED ORDER — LIDOCAINE-EPINEPHRINE (PF) 2 %-1:200000 IJ SOLN
INTRAMUSCULAR | Status: AC
  Filled 2019-06-18: qty 20

## 2019-06-18 MED ORDER — LEVETIRACETAM IN NACL 500 MG/100ML IV SOLN
500.0000 mg | Freq: Two times a day (BID) | INTRAVENOUS | Status: DC
  Administered 2019-06-18 – 2019-06-20 (×5): 500 mg via INTRAVENOUS
  Filled 2019-06-18 (×6): qty 100

## 2019-06-18 MED ORDER — MIDAZOLAM HCL 2 MG/2ML IJ SOLN
INTRAMUSCULAR | Status: AC
  Filled 2019-06-18: qty 2

## 2019-06-18 MED ORDER — LIDOCAINE-EPINEPHRINE 1 %-1:100000 IJ SOLN
INTRAMUSCULAR | Status: AC | PRN
  Administered 2019-06-18: 10 mL

## 2019-06-18 MED ORDER — FENTANYL CITRATE (PF) 100 MCG/2ML IJ SOLN
INTRAMUSCULAR | Status: AC | PRN
  Administered 2019-06-18: 50 ug via INTRAVENOUS

## 2019-06-18 MED ORDER — IOHEXOL 300 MG/ML  SOLN
75.0000 mL | Freq: Once | INTRAMUSCULAR | Status: AC | PRN
  Administered 2019-06-18: 75 mL via INTRAVENOUS

## 2019-06-18 NOTE — Procedures (Signed)
Pre Procedure Dx: Concern for RCC, now with bulky left cervical lymphadenopathy Post Procedural Dx: Same  Technically successful US guided biopsy of bulky left cervical lymphadenopathy.   EBL: None  No immediate complications.   Ronny Bacon, MD Pager #: [phone]

## 2019-06-18 NOTE — Telephone Encounter (Signed)
No los 2/11

## 2019-06-18 NOTE — Progress Notes (Signed)
   06/18/19 1600  Clinical Encounter Type  Visited With Patient not available  Visit Type Initial;Psychological support;Spiritual support  Referral From Nurse  Consult/Referral To Chaplain  Spiritual Encounters  Spiritual Needs Emotional;Other (Comment) (Spiritual Care Conversation/Support)  Stress Factors  Patient Stress Factors Not reviewed  Family Stress Factors Not reviewed   I tried to call Mr. Beckstrand wife per referral from the nurse. Mrs. Rackow did not answer. I will refer to our weekend on-call Chaplain for follow-up.     Chaplain Shanon Ace M.Div., [facility]

## 2019-06-18 NOTE — Progress Notes (Signed)
Patient's wife has permission to stay over night per our Surveyor, quantity.

## 2019-06-18 NOTE — Progress Notes (Signed)
Chief Complaint: Patient was seen in consultation today for tissue biopsy  Referring Physician(s): Dr. Marin Olp  Supervising Physician: Sandi Mariscal  Patient Status: [facility] [place] - In-pt  History of Present Illness: Mark Tanner is a 49 y.o. male admitted with weight loss and confusion. His workup has found evidence of widespread metastatic process. Oncology consulted and requests biopsy for tissue diagnosis. Imaging reviewed with Dr. Pascal Lux. PMHx, meds, labs, imaging, allergies reviewed. Has been NPO today as directed. Discussed with wife over phone   Past Medical History:  Diagnosis Date  . High blood pressure   . 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 Seizures ([place])     Past Surgical History:  Procedure Laterality Date  . None      Allergies: Patient has no known allergies.  Medications:  Current Facility-Administered Medications:  .  0.9 %  sodium chloride infusion, , Intravenous, Continuous, Ennever, Rudell Cobb, MD, Last Rate: 75 mL/hr at 06/18/19 0400, Rate Verify at 06/18/19 0400 .  calcitonin (MIACALCIN) injection 360 Units, 360 Units, Subcutaneous, BID, Volanda Napoleon, MD, 360 Units at 06/17/19 2208 .  dexamethasone (DECADRON) 20 mg in sodium chloride 0.9 % 50 mL IVPB, 20 mg, Intravenous, Daily, Polly Cobia, RPH, Last Rate: 208 mL/hr at 06/18/19 0825, 20 mg at 06/18/19 0825 .  dronabinol (MARINOL) capsule 5 mg, 5 mg, Oral, BID AC, Volanda Napoleon, MD, 5 mg at 06/17/19 1824 .  enoxaparin (LOVENOX) injection 40 mg, 40 mg, Subcutaneous, Q24H, Ennever, Rudell Cobb, MD, 40 mg at 06/17/19 2121 .  famotidine (PEPCID) IVPB 20 mg premix, 20 mg, Intravenous, Q12H, Volanda Napoleon, MD, Stopped at 06/17/19 2238 .  HYDROmorphone (DILAUDID) injection 1 mg, 1 mg, Intravenous, Q3H PRN, Volanda Napoleon, MD .  levETIRAcetam (KEPPRA) tablet 500 mg, 500 mg, Oral, BID, Volanda Napoleon,  MD, 500 mg at 06/17/19 2121 .  multivitamin with minerals tablet 1 tablet, 1 tablet, Oral, Daily, Volanda Napoleon, MD, 1 tablet at 06/17/19 1824 .  sodium chloride flush (NS) 0.9 % injection 10-40 mL, 10-40 mL, Intracatheter, PRN, Volanda Napoleon, MD    Family History  Problem Relation Age of Onset  . Cancer - Colon Father     Social History   Socioeconomic History  . Marital status: Married    Spouse name: [name]  . Number of children: 0  . Years of education: 61  . Highest education level: Not on file  Occupational History    Employer: [employer];  Tobacco Use  . Smoking status: Current Every Day Smoker    Packs/day: 1.00    Types: Cigarettes  . Smokeless tobacco: Never Used  Substance and Sexual Activity  . Alcohol use: Yes    Alcohol/week: 1.0 standard drinks    Types: 1 Cans of beer per week  . Drug use: No  . Sexual activity: Not on file  Other Topics Concern  . Not on file  Social History Narrative   Patient is married [name]) and lives with his wife.   Patient works full-time.   Patient has a high school education.   Patient is right-handed.   Patient drinks three caffeine drinks daily.   Social Determinants of Health   Financial Resource Strain:   . Difficulty of Paying Living Expenses: Not on file  Food Insecurity:   . Worried About Charity fundraiser in the Last Year: Not on file  .  Ran Out of Food in the Last Year: Not on file  Transportation Needs:   . Lack of Transportation (Medical): Not on file  . Lack of Transportation (Non-Medical): Not on file  Physical Activity:   . Days of Exercise per Week: Not on file  . Minutes of Exercise per Session: Not on file  Stress:   . Feeling of Stress : Not on file  Social Connections:   . Frequency of Communication with Friends and Family: Not on file  . Frequency of Social Gatherings with Friends and Family: Not on file  . Attends Religious Services: Not on file  . Active Member of Clubs or  Organizations: Not on file  . Attends Archivist Meetings: Not on file  . Marital Status: Not on file     Review of Systems: A 12 point ROS discussed and pertinent positives are indicated in the HPI above.  All other systems are negative.  Review of Systems  Vital Signs: BP 108/62 (BP Location: Right Arm)   Pulse 82   Temp 97.6 F (36.4 C) (Oral)   Resp 17   Ht 5\' 10"  (1.778 m)   Wt 62.3 kg   SpO2 96%   BMI 19.71 kg/m   Physical Exam Constitutional:      Comments: Thin, cachectic appearing.  HENT:     Mouth/Throat:     Mouth: Mucous membranes are moist.     Pharynx: Oropharynx is clear.  Neck:     Comments: Palpable (L)neck/supraclavicular mass/adenopathy, NT Cardiovascular:     Rate and Rhythm: Normal rate and regular rhythm.     Heart sounds: Normal heart sounds.  Pulmonary:     Effort: Pulmonary effort is normal. No respiratory distress.     Breath sounds: Normal breath sounds.  Neurological:     Comments: Confused but follows commands.       Imaging: CT CHEST W CONTRAST  Result Date: 06/17/2019 CLINICAL DATA:  Pt has hx of several months of debilitating lower back pain, pt is altered and confused at time, unable to recall personal hx, but does endorse pain, weight loss of more than 50lbs He is unable to perform daily tasks without assistance no prev sx, MRI @ [facility]OS in PACS showed abnormal EXAM: CT CHEST, ABDOMEN, AND PELVIS WITH CONTRAST TECHNIQUE: Multidetector CT imaging of the chest, abdomen and pelvis was performed following the standard protocol during bolus administration of intravenous contrast. CONTRAST:  44mL ISOVUE-300 IOPAMIDOL (ISOVUE-300) INJECTION 61% COMPARISON:  Lumbar MRI, 06/07/2019 FINDINGS: CT CHEST FINDINGS Cardiovascular: Heart is normal in size and configuration mild three-vessel coronary artery calcifications. No pericardial effusion. Great vessels are normal in caliber. No aortic atherosclerosis. Mediastinum/Nodes: Enlarged left neck  base lymph nodes, largest measuring 2.9 cm in short axis. Thyroid is unremarkable. No axillary masses or enlarged lymph nodes. No mediastinal or hilar masses. Mildly enlarged left hilar lymph node 1.2 cm in short axis. Trachea and esophagus are unremarkable. Lungs/Pleura: Numerous pulmonary nodules bilaterally. Several dominant nodules or measure. Largest left nodule, posteromedial left lower lobe, image 111, series 5, 1.4 cm. Largest right lung nodule, right lower lobe, image 109, 1.6 x 1.1 cm. No lung consolidation or pulmonary edema. Trace left pleural effusion. No pneumothorax. CT ABDOMEN PELVIS FINDINGS Hepatobiliary: Multiple liver masses. Largest mass arising from the inferior right lobe, segment 6, 2.3 cm. Largest lesion from the left lobe, segment 4B, 2.3 cm Liver normal in overall size and attenuation. Gallbladder is unremarkable. No bile duct dilation. Pancreas: Unremarkable. No  pancreatic ductal dilatation or surrounding inflammatory changes. Spleen: Normal in size without focal abnormality. Adrenals/Urinary Tract: Heterogeneous mass from the upper pole of the right kidney, measuring approximately 7.7 x 6.3 x 7.1 cm. This mass is contiguous with a larger hypoattenuating infiltrative mass at surrounds and significantly narrows and encases the inferior vena cava, encases the abdominal aorta, celiac axis, superior mesenteric artery and renal arteries and extends to the lower thoracic and upper lumbar spine. The mass abuts and thins the portal vein without evidence of invasion. This contiguous mass along with the renal mass measures a combined 18 x 11 x 12 cm. The mass contacts the medial margin of the left kidney. It broadly abuts the posterior aspect of the liver. It involves the vertebra eroding portions of the T12 through L3 vertebral bodies. Mass obscures the right adrenal gland. Mass involves the medial limb of the left adrenal gland. Lateral limb is unremarkable. Left kidney is normal in size and  attenuation. Mild left hydronephrosis. No left renal mass or stone. Ureters normal in course and in caliber. Bladder is unremarkable. Stomach/Bowel: Normal stomach. Small bowel and colon are normal in caliber. No wall thickening. No inflammation. No involvement by the above described mass. Normal appendix visualized. Vascular/Lymphatic: Mildly enlarged Peri celiac lymph node measuring 1.4 cm short axis separate from the large mass. Left periaortic lymph node at the level of the lower pole the left kidney, 1.1 cm in short axis. Mild aortic atherosclerosis. Portal vein is narrowed by the mass but not occluded. No convincing invasion. Reproductive: Unremarkable. Other: Small amount ascites in the posterior pelvic recess. MUSCULOSKELETAL FINDINGS 2 widespread metastatic skeletal disease reflected by numerous lucent bone lesions. Lesions are visualized throughout the spine, pelvis, sternum, left scapula and several ribs. There are expansile lesions from the right anterior fourth rib and left anterior second rib. There are multiple pathologic compression fractures including T2, T4, T5, T6, T8, T12, L1, L2 and L3. IMPRESSION: 1. Large retroperitoneal mass with extensive metastatic disease. This mass is most likely a renal cell carcinoma arising from the upper pole the right kidney, although the mass could potentially be primary with secondary involvement of the kidney. Mass surrounds the aorta, its branch vessels, the inferior vena cava and abuts and thins the portal vein. Overall dimensions of the retroperitoneal mass are 1.8 x 1.2 x 1.1 cm. 2. Metastatic disease includes retroperitoneal adenopathy, left neck base adenopathy, multiple lung nodules, multiple liver metastatic lesions and extensive osseous metastatic disease including direct osseous invasion by the retroperitoneal mass. Electronically Signed   By: Lajean Manes M.D.   On: 06/17/2019 13:42   CT ABDOMEN PELVIS W CONTRAST  Result Date: 06/17/2019 CLINICAL  DATA:  Pt has hx of several months of debilitating lower back pain, pt is altered and confused at time, unable to recall personal hx, but does endorse pain, weight loss of more than 50lbs He is unable to perform daily tasks without assistance no prev sx, MRI @ [facility]OS in PACS showed abnormal EXAM: CT CHEST, ABDOMEN, AND PELVIS WITH CONTRAST TECHNIQUE: Multidetector CT imaging of the chest, abdomen and pelvis was performed following the standard protocol during bolus administration of intravenous contrast. CONTRAST:  35mL ISOVUE-300 IOPAMIDOL (ISOVUE-300) INJECTION 61% COMPARISON:  Lumbar MRI, 06/07/2019 FINDINGS: CT CHEST FINDINGS Cardiovascular: Heart is normal in size and configuration mild three-vessel coronary artery calcifications. No pericardial effusion. Great vessels are normal in caliber. No aortic atherosclerosis. Mediastinum/Nodes: Enlarged left neck base lymph nodes, largest measuring 2.9 cm in short axis.  Thyroid is unremarkable. No axillary masses or enlarged lymph nodes. No mediastinal or hilar masses. Mildly enlarged left hilar lymph node 1.2 cm in short axis. Trachea and esophagus are unremarkable. Lungs/Pleura: Numerous pulmonary nodules bilaterally. Several dominant nodules or measure. Largest left nodule, posteromedial left lower lobe, image 111, series 5, 1.4 cm. Largest right lung nodule, right lower lobe, image 109, 1.6 x 1.1 cm. No lung consolidation or pulmonary edema. Trace left pleural effusion. No pneumothorax. CT ABDOMEN PELVIS FINDINGS Hepatobiliary: Multiple liver masses. Largest mass arising from the inferior right lobe, segment 6, 2.3 cm. Largest lesion from the left lobe, segment 4B, 2.3 cm Liver normal in overall size and attenuation. Gallbladder is unremarkable. No bile duct dilation. Pancreas: Unremarkable. No pancreatic ductal dilatation or surrounding inflammatory changes. Spleen: Normal in size without focal abnormality. Adrenals/Urinary Tract: Heterogeneous mass from the upper  pole of the right kidney, measuring approximately 7.7 x 6.3 x 7.1 cm. This mass is contiguous with a larger hypoattenuating infiltrative mass at surrounds and significantly narrows and encases the inferior vena cava, encases the abdominal aorta, celiac axis, superior mesenteric artery and renal arteries and extends to the lower thoracic and upper lumbar spine. The mass abuts and thins the portal vein without evidence of invasion. This contiguous mass along with the renal mass measures a combined 18 x 11 x 12 cm. The mass contacts the medial margin of the left kidney. It broadly abuts the posterior aspect of the liver. It involves the vertebra eroding portions of the T12 through L3 vertebral bodies. Mass obscures the right adrenal gland. Mass involves the medial limb of the left adrenal gland. Lateral limb is unremarkable. Left kidney is normal in size and attenuation. Mild left hydronephrosis. No left renal mass or stone. Ureters normal in course and in caliber. Bladder is unremarkable. Stomach/Bowel: Normal stomach. Small bowel and colon are normal in caliber. No wall thickening. No inflammation. No involvement by the above described mass. Normal appendix visualized. Vascular/Lymphatic: Mildly enlarged Peri celiac lymph node measuring 1.4 cm short axis separate from the large mass. Left periaortic lymph node at the level of the lower pole the left kidney, 1.1 cm in short axis. Mild aortic atherosclerosis. Portal vein is narrowed by the mass but not occluded. No convincing invasion. Reproductive: Unremarkable. Other: Small amount ascites in the posterior pelvic recess. MUSCULOSKELETAL FINDINGS 2 widespread metastatic skeletal disease reflected by numerous lucent bone lesions. Lesions are visualized throughout the spine, pelvis, sternum, left scapula and several ribs. There are expansile lesions from the right anterior fourth rib and left anterior second rib. There are multiple pathologic compression fractures  including T2, T4, T5, T6, T8, T12, L1, L2 and L3. IMPRESSION: 1. Large retroperitoneal mass with extensive metastatic disease. This mass is most likely a renal cell carcinoma arising from the upper pole the right kidney, although the mass could potentially be primary with secondary involvement of the kidney. Mass surrounds the aorta, its branch vessels, the inferior vena cava and abuts and thins the portal vein. Overall dimensions of the retroperitoneal mass are 1.8 x 1.2 x 1.1 cm. 2. Metastatic disease includes retroperitoneal adenopathy, left neck base adenopathy, multiple lung nodules, multiple liver metastatic lesions and extensive osseous metastatic disease including direct osseous invasion by the retroperitoneal mass. Electronically Signed   By: Lajean Manes M.D.   On: 06/17/2019 13:42    Labs:  CBC: Recent Labs    06/17/19 1409 06/17/19 1717 06/18/19 0548  WBC 15.0* 13.9* 13.3*  HGB 10.8* 10.3*  11.2*  HCT 30.9* 30.3* 31.9*  PLT 416* 360 409*    COAGS: Recent Labs    06/18/19 0548  INR 1.1  APTT 32    BMP: Recent Labs    06/17/19 1409 06/17/19 1717 06/18/19 0548  NA 118*  --  120*  K 4.7  --  4.7  CL 82*  --  83*  CO2 27  --  26  GLUCOSE 87  --  109*  BUN 54*  --  57*  CALCIUM 14.9*  --  12.6*  CREATININE 1.66* 1.58* 1.58*  GFRNONAA 48* 51* 51*  GFRAA 56* 59* 59*    LIVER FUNCTION TESTS: Recent Labs    06/17/19 1409 06/18/19 0548  BILITOT 0.5 0.8  AST 24 23  ALT 14 19  ALKPHOS 86 85  PROT 5.9* 6.2*  ALBUMIN 3.7 3.3*    TUMOR MARKERS: No results for input(s): AFPTM, CEA, CA199, CHROMGRNA in the last 8760 hours.  Assessment and Plan: Metastatic process, unknown etiology Imaging reviewed, extensive RP mass, with bone lesions as well as left neck adenopathy. Feel (L)neck amenable to US guided core biopsies and should yield plenty of tissue for molecular studies. Labs reviewed. Risks and benefits of (L)neck mass biopsy was discussed with the patient's  wife, but not limited to bleeding, infection, damage to adjacent structures or low yield requiring additional tests.  All of the questions were answered and there is agreement to proceed.  Consent signed and in chart.    Thank you for this interesting consult.  I greatly enjoyed meeting Ken Cambray and look forward to participating in their care.  A copy of this report was sent to the requesting provider on this date.  Electronically Signed: Ascencion Dike, PA-C 06/18/2019, 11:35 AM   I spent a total of 25 minutes in face to face in clinical consultation, greater than 50% of which was counseling/coordinating care for left neck mass bx

## 2019-06-18 NOTE — Progress Notes (Signed)
Patient admission assessment incomplete. Patient confused unable to answer questions. Will communicate to up coming RN to try  when patient wife visiting.

## 2019-06-18 NOTE — Progress Notes (Signed)
Mr. Corsino is still somewhat disoriented.  His calcium is high but coming down.  His calcium corrected is probably close to 14 now.  He is on IV fluids.  His sodium is slowly coming up.  His chloride is slowly coming up.  He has this massive retroperitoneal tumor.  This certainly could be emanating from the right kidney.  We will somehow need to get a biopsy.  Hopefully, radiology will be able to do this for [place].  This overall is just a very difficult problem.  Again, I think the only tumor we will be able to treat would be lymphoma.  His CT scan shows widespread disease.  He has lung, liver, bone mets.  He has a pathologic fractures in his back.  I would consider kyphoplasty but I am unsure which vertebral body would be reasonable to try to help with his pain.  I do not think he really is eating much.  I do have him on some Decadron which hopefully will help with his bony pain and inflammation.  It may help with his appetite.  I will cut back a little bit on the Dilaudid.  We will see if this can help improve his overall state.  I worry that he does have brain metastasis.  The problem is trying to get a MRI or CT scan of his brain.  Hopefully we will be able to do this.  This can certainly guide [place] as to what are treatment recommendations might be.  I think he does have brain metastasis, then no matter what we find, we will not be able to treat and I would definitely get Hospice involved.  His vital signs are all stable.  His temperature is 97.6.  Pulse is 82.  Blood pressure 108/62.  Overall, there is no change in his physical exam.  We are still just trying to settle things down for him.  I does want to make sure that he is not hurting.  He does seem more comfortable.  Hopefully, we will be able to get his mental state a little bit better.  I very much appreciate the outstanding care that he is getting from the staff up on [address].  Lattie Haw, MD  [place] 6:33

## 2019-06-19 LAB — COMPREHENSIVE METABOLIC PANEL
ALT: 16 U/L (ref 0–44)
AST: 17 U/L (ref 15–41)
Albumin: 3.4 g/dL — ABNORMAL LOW (ref 3.5–5.0)
Alkaline Phosphatase: 76 U/L (ref 38–126)
Anion gap: 12 (ref 5–15)
BUN: 72 mg/dL — ABNORMAL HIGH (ref 6–20)
CO2: 22 mmol/L (ref 22–32)
Calcium: 11.7 mg/dL — ABNORMAL HIGH (ref 8.9–10.3)
Chloride: 90 mmol/L — ABNORMAL LOW (ref 98–111)
Creatinine, Ser: 1.5 mg/dL — ABNORMAL HIGH (ref 0.61–1.24)
GFR calc Af Amer: >60 mL/min (ref >60)
GFR calc non Af Amer: 55 mL/min — ABNORMAL LOW (ref >60)
Glucose, Bld: 80 mg/dL (ref 70–99)
Potassium: 4.5 mmol/L (ref 3.5–5.1)
Sodium: 124 mmol/L — ABNORMAL LOW (ref 135–145)
Total Bilirubin: 0.6 mg/dL (ref 0.3–1.2)
Total Protein: 5.8 g/dL — ABNORMAL LOW (ref 6.5–8.1)

## 2019-06-19 LAB — CBC WITH DIFFERENTIAL/PLATELET
Abs Immature Granulocytes: 0.25 10*3/uL — ABNORMAL HIGH (ref 0.00–0.07)
Basophils Absolute: 0 10*3/uL (ref 0.0–0.1)
Basophils Relative: 0 %
Eosinophils Absolute: 0 10*3/uL (ref 0.0–0.5)
Eosinophils Relative: 0 %
HCT: 29.2 % — ABNORMAL LOW (ref 39.0–52.0)
Hemoglobin: 9.9 g/dL — ABNORMAL LOW (ref 13.0–17.0)
Immature Granulocytes: 2 %
Lymphocytes Relative: 8 %
Lymphs Abs: 1 10*3/uL (ref 0.7–4.0)
MCH: 29.6 pg (ref 26.0–34.0)
MCHC: 33.9 g/dL (ref 30.0–36.0)
MCV: 87.2 fL (ref 80.0–100.0)
Monocytes Absolute: 1.4 10*3/uL — ABNORMAL HIGH (ref 0.1–1.0)
Monocytes Relative: 11 %
Neutro Abs: 10.3 10*3/uL — ABNORMAL HIGH (ref 1.7–7.7)
Neutrophils Relative %: 79 %
Platelets: 420 10*3/uL — ABNORMAL HIGH (ref 150–400)
RBC: 3.35 MIL/uL — ABNORMAL LOW (ref 4.22–5.81)
RDW: 13.6 % (ref 11.5–15.5)
WBC: 13 10*3/uL — ABNORMAL HIGH (ref 4.0–10.5)
nRBC: 0 % (ref 0.0–0.2)

## 2019-06-19 LAB — LACTATE DEHYDROGENASE: LDH: 270 U/L — ABNORMAL HIGH (ref 98–192)

## 2019-06-19 LAB — PREALBUMIN: Prealbumin: 22.2 mg/dL (ref 18–38)

## 2019-06-19 MED ORDER — LORAZEPAM 2 MG/ML IJ SOLN
1.0000 mg | Freq: Four times a day (QID) | INTRAMUSCULAR | Status: AC | PRN
  Administered 2019-06-19 (×2): 1 mg via INTRAVENOUS
  Filled 2019-06-19 (×2): qty 1

## 2019-06-19 NOTE — Progress Notes (Signed)
Mark Tanner is much more awake this morning.  He was somewhat agitated last night.  This might be from the steroids.  I will go ahead and stop these now.  I am absolutely impressed that radiology got to him yesterday for the biopsy.  Hopefully, Monday we will have a preliminary report.  His calcium is coming down nicely.  His calcium is 11.7 now.  Surprisingly, his prealbumin is much better than I thought.  His prealbumin was 22.5.  Thankfully, the CT of the brain did not show any metastasis.  His pain seems to be doing a little bit better.  I will start a little Ativan for his anxiety.  Hopefully this might help him rest a little bit better.  His sodium and chloride are coming up slowly with hydration.  His creatinine is improving.  His creatinine is 1.5.  Now that he is more awake, we will try him on a soft diet.  Para he did have a bowel movement yesterday.  He still has the mittens on.  I want to keep them on for right now.  I just do not want him pulling on his IV.   On his physical exam, his vital signs are temperature 98.9.  Pulse 94.  Blood pressure 132/68.  His oral exam shows no thrush.  Oromucosa is slightly dry.  His lungs are clear.  Cardiac exam regular rate and rhythm.  Abdomen is soft.  Bowel sounds are still decreased.  There is no guarding or rebound tenderness.  Extremities shows good movement.  He has decent strength.  Neurological exam is nonfocal.  Mark Tanner has widely metastatic malignancy.  I have to suspect this is going to be metastatic renal cell carcinoma.  I think the only malignancy that we could treat would be lymphoma.  I will hold off on any scans over the weekend.  I am just glad to see that he is coming around a little bit better.  His wife is doing a great job helping [place] out.  Lattie Haw, MD  Vonna Kotyk 1:9

## 2019-06-20 LAB — CBC WITH DIFFERENTIAL/PLATELET
Abs Immature Granulocytes: 0.19 10*3/uL — ABNORMAL HIGH (ref 0.00–0.07)
Basophils Absolute: 0.1 10*3/uL (ref 0.0–0.1)
Basophils Relative: 0 %
Eosinophils Absolute: 0 10*3/uL (ref 0.0–0.5)
Eosinophils Relative: 0 %
HCT: 30.7 % — ABNORMAL LOW (ref 39.0–52.0)
Hemoglobin: 10.3 g/dL — ABNORMAL LOW (ref 13.0–17.0)
Immature Granulocytes: 1 %
Lymphocytes Relative: 8 %
Lymphs Abs: 1.1 10*3/uL (ref 0.7–4.0)
MCH: 29.8 pg (ref 26.0–34.0)
MCHC: 33.6 g/dL (ref 30.0–36.0)
MCV: 88.7 fL (ref 80.0–100.0)
Monocytes Absolute: 1.3 10*3/uL — ABNORMAL HIGH (ref 0.1–1.0)
Monocytes Relative: 10 %
Neutro Abs: 11 10*3/uL — ABNORMAL HIGH (ref 1.7–7.7)
Neutrophils Relative %: 81 %
Platelets: 408 10*3/uL — ABNORMAL HIGH (ref 150–400)
RBC: 3.46 MIL/uL — ABNORMAL LOW (ref 4.22–5.81)
RDW: 13.6 % (ref 11.5–15.5)
WBC: 13.6 10*3/uL — ABNORMAL HIGH (ref 4.0–10.5)
nRBC: 0 % (ref 0.0–0.2)

## 2019-06-20 LAB — COMPREHENSIVE METABOLIC PANEL
ALT: 16 U/L (ref 0–44)
AST: 19 U/L (ref 15–41)
Albumin: 3.6 g/dL (ref 3.5–5.0)
Alkaline Phosphatase: 80 U/L (ref 38–126)
Anion gap: 10 (ref 5–15)
BUN: 62 mg/dL — ABNORMAL HIGH (ref 6–20)
CO2: 25 mmol/L (ref 22–32)
Calcium: 11.3 mg/dL — ABNORMAL HIGH (ref 8.9–10.3)
Chloride: 97 mmol/L — ABNORMAL LOW (ref 98–111)
Creatinine, Ser: 1.25 mg/dL — ABNORMAL HIGH (ref 0.61–1.24)
GFR calc Af Amer: >60 mL/min (ref >60)
GFR calc non Af Amer: >60 mL/min (ref >60)
Glucose, Bld: 89 mg/dL (ref 70–99)
Potassium: 3.8 mmol/L (ref 3.5–5.1)
Sodium: 132 mmol/L — ABNORMAL LOW (ref 135–145)
Total Bilirubin: 0.7 mg/dL (ref 0.3–1.2)
Total Protein: 6.1 g/dL — ABNORMAL LOW (ref 6.5–8.1)

## 2019-06-20 LAB — LACTATE DEHYDROGENASE: LDH: 299 U/L — ABNORMAL HIGH (ref 98–192)

## 2019-06-20 LAB — LEVETIRACETAM LEVEL: Levetiracetam Lvl: 26.6 ug/mL (ref 10.0–40.0)

## 2019-06-20 MED ORDER — FAMOTIDINE 20 MG PO TABS
20.0000 mg | ORAL_TABLET | Freq: Two times a day (BID) | ORAL | Status: DC
  Administered 2019-06-20 – 2019-06-23 (×7): 20 mg via ORAL
  Filled 2019-06-20 (×7): qty 1

## 2019-06-20 MED ORDER — LEVETIRACETAM 500 MG PO TABS
500.0000 mg | ORAL_TABLET | Freq: Two times a day (BID) | ORAL | Status: DC
  Administered 2019-06-20 – 2019-06-21 (×3): 500 mg via ORAL
  Filled 2019-06-20 (×3): qty 1

## 2019-06-20 NOTE — Progress Notes (Signed)
PHARMACIST - PHYSICIAN COMMUNICATION  CONCERNING: IV to Oral Route Change Policy  RECOMMENDATION: This patient is receiving keppra and pepcid by the intravenous route.  Based on criteria approved by the Pharmacy and Therapeutics Committee, the intravenous medication(s) is/are being converted to the equivalent oral dose form(s).   DESCRIPTION: These criteria include:  The patient is eating (either orally or via tube) and/or has been taking other orally administered medications for a least 24 hours  The patient has no evidence of active gastrointestinal bleeding or impaired GI absorption (gastrectomy, short bowel, patient on TNA or NPO).  If you have questions about this conversion, please contact the Pharmacy Department  []   [phone] )  [facility] []   [phone] )  [facility] []   [phone] )  [facility] []   [phone] )  [facility] [x]   [phone] )  [place], [place].D [phone] 06/20/2019 2:08 PM

## 2019-06-20 NOTE — Progress Notes (Signed)
IP PROGRESS NOTE  Subjective:   He is alert, reports back pain.  He remains restrained with hand mittens.  Objective: Vital signs in last 24 hours: Blood pressure (!) 158/103, pulse (!) 107, temperature (!) 97.5 F (36.4 C), temperature source Oral, resp. rate 15, height 5\' 10"  (1.778 m), weight 137 lb 5.6 oz (62.3 kg), SpO2 100 %.  Intake/Output from previous day: 02/13 0701 - 02/14 0700 In: 1526.1 [I.V.:1256.9; IV Piggyback:269.2] Out: -   Physical Exam:  HEENT: No thrush Lungs: Scattered wheeze anteriorly, no respiratory distress Cardiac: Regular rate and rhythm Abdomen: Nontender, no hepatosplenomegaly, no mass Extremities: No leg edema Neurologic: Alert, follows commands, not oriented Lymph nodes: Firm nodal mass in the medial left supraclavicular fossa  Portacath/PICC-without erythema  Lab Results: Recent Labs    06/19/19 0451 06/20/19 0543  WBC 13.0* 13.6*  HGB 9.9* 10.3*  HCT 29.2* 30.7*  PLT 420* 408*    BMET Recent Labs    06/19/19 0451 06/20/19 0543  NA 124* 132*  K 4.5 3.8  CL 90* 97*  CO2 22 25  GLUCOSE 80 89  BUN 72* 62*  CREATININE 1.50* 1.25*  CALCIUM 11.7* 11.3*    No results found for: CEA1  Studies/Results: CT HEAD W & WO CONTRAST  Result Date: 06/18/2019 CLINICAL DATA:  Confusion. Metastatic renal cancer. EXAM: CT HEAD WITHOUT AND WITH CONTRAST TECHNIQUE: Contiguous axial images were obtained from the base of the skull through the vertex without and with intravenous contrast CONTRAST:  91mL OMNIPAQUE IOHEXOL 300 MG/ML  SOLN COMPARISON:  None. FINDINGS: Brain: There is no evidence of acute infarct, intracranial hemorrhage, mass, midline shift, or extra-axial fluid collection. The ventricles and sulci are normal. No abnormal enhancement is identified. Vascular: Grossly patent dural venous sinuses. Skull: No suspicious osseous lesion. Suspected remote nasal bone fracture. Sinuses/Orbits: Previous right orbital floor fracture repair. Minimal  scattered mucosal thickening in the paranasal sinuses. Clear mastoid air cells. Other: None. IMPRESSION: No evidence of intracranial metastases or acute abnormality. Electronically Signed   By: Logan Bores M.D.   On: 06/18/2019 16:30   [place] CORE BIOPSY (LYMPH NODES)  Result Date: 06/18/2019 INDICATION: Concern for metastatic RCC versus lymphoma. Please perform CT-guided biopsy for tissue diagnostic purposes. EXAM: ULTRASOUND-GUIDED LEFT SUPRACLAVICULAR LYMPH NODE BIOPSY COMPARISON:  CT the chest, abdomen and pelvis-06/17/2019 MEDICATIONS: None ANESTHESIA/SEDATION: Moderate (conscious) sedation was employed during this procedure. A total of Versed 2 mg and Fentanyl 50 mcg was administered intravenously. Moderate Sedation Time: 11 minutes. The patient's level of consciousness and vital signs were monitored continuously by radiology nursing throughout the procedure under my direct supervision. COMPLICATIONS: None immediate. TECHNIQUE: Informed written consent was obtained from the patient after a discussion of the risks, benefits and alternatives to treatment. Questions regarding the procedure were encouraged and answered. Initial ultrasound scanning demonstrated a large, at least 5.2 x 3.2 x 3.8 cm left supraclavicular mass correlating with the nodal conglomeration seen on preceding chest CT images 1 - 6, series 6). An ultrasound image was saved for documentation purposes. The procedure was planned. A timeout was performed prior to the initiation of the procedure. The operative was prepped and draped in the usual sterile fashion, and a sterile drape was applied covering the operative field. A timeout was performed prior to the initiation of the procedure. Local anesthesia was provided with 1% lidocaine with epinephrine. Under direct ultrasound guidance, an 18 gauge core needle device was utilized to obtain to obtain 7 core needle biopsies of the dominant  left supraclavicular nodal conglomeration. The samples were  placed in saline and submitted to pathology. The needle was removed and hemostasis was achieved with manual compression. Post procedure scan was negative for significant hematoma. A dressing was placed. The patient tolerated the procedure well without immediate postprocedural complication. IMPRESSION: Technically successful ultrasound guided biopsy of dominant left supraclavicular nodal conglomeration. Electronically Signed   By: Sandi Mariscal M.D.   On: 06/18/2019 15:38    Medications: I have reviewed the patient's current medications.  Assessment/Plan:  1.  Advanced stage malignancy-retroperitoneal mass, metastatic adenopathy, liver/lung lesions  Ultrasound-guided core biopsy of left supraclavicular mass 06/18/2019-pathology pending  2.  Hypercalcemia of malignancy, status post Zometa 06/17/2019- 3.  Renal failure-improved with hydration and treatment of hypercalcemia 4.  Anemia 5.  Altered mental status secondary to hypercalcemia 6.  Seizure disorder  The plan is to continue supportive care and follow-up on the biopsy from 06/18/2019.  Dr. Marin Olp will decide on systemic therapy based on the pathology result.   LOS: 3 days   Betsy Coder, MD   06/20/2019, 7:40 AM

## 2019-06-21 LAB — CBC WITH DIFFERENTIAL/PLATELET
Abs Immature Granulocytes: 0.21 10*3/uL — ABNORMAL HIGH (ref 0.00–0.07)
Basophils Absolute: 0.1 10*3/uL (ref 0.0–0.1)
Basophils Relative: 1 %
Eosinophils Absolute: 0.1 10*3/uL (ref 0.0–0.5)
Eosinophils Relative: 0 %
HCT: 29.1 % — ABNORMAL LOW (ref 39.0–52.0)
Hemoglobin: 9.6 g/dL — ABNORMAL LOW (ref 13.0–17.0)
Immature Granulocytes: 1 %
Lymphocytes Relative: 7 %
Lymphs Abs: 1 10*3/uL (ref 0.7–4.0)
MCH: 29.9 pg (ref 26.0–34.0)
MCHC: 33 g/dL (ref 30.0–36.0)
MCV: 90.7 fL (ref 80.0–100.0)
Monocytes Absolute: 1.5 10*3/uL — ABNORMAL HIGH (ref 0.1–1.0)
Monocytes Relative: 9 %
Neutro Abs: 13.2 10*3/uL — ABNORMAL HIGH (ref 1.7–7.7)
Neutrophils Relative %: 82 %
Platelets: 340 10*3/uL (ref 150–400)
RBC: 3.21 MIL/uL — ABNORMAL LOW (ref 4.22–5.81)
RDW: 13.8 % (ref 11.5–15.5)
WBC: 16.1 10*3/uL — ABNORMAL HIGH (ref 4.0–10.5)
nRBC: 0 % (ref 0.0–0.2)

## 2019-06-21 LAB — COMPREHENSIVE METABOLIC PANEL
ALT: 15 U/L (ref 0–44)
AST: 17 U/L (ref 15–41)
Albumin: 3.2 g/dL — ABNORMAL LOW (ref 3.5–5.0)
Alkaline Phosphatase: 68 U/L (ref 38–126)
Anion gap: 10 (ref 5–15)
BUN: 52 mg/dL — ABNORMAL HIGH (ref 6–20)
CO2: 21 mmol/L — ABNORMAL LOW (ref 22–32)
Calcium: 10.2 mg/dL (ref 8.9–10.3)
Chloride: 100 mmol/L (ref 98–111)
Creatinine, Ser: 1.11 mg/dL (ref 0.61–1.24)
GFR calc Af Amer: >60 mL/min (ref >60)
GFR calc non Af Amer: >60 mL/min (ref >60)
Glucose, Bld: 87 mg/dL (ref 70–99)
Potassium: 3.8 mmol/L (ref 3.5–5.1)
Sodium: 131 mmol/L — ABNORMAL LOW (ref 135–145)
Total Bilirubin: 0.5 mg/dL (ref 0.3–1.2)
Total Protein: 5.7 g/dL — ABNORMAL LOW (ref 6.5–8.1)

## 2019-06-21 LAB — LACTATE DEHYDROGENASE: LDH: 260 U/L — ABNORMAL HIGH (ref 98–192)

## 2019-06-21 MED ORDER — LORAZEPAM 2 MG/ML IJ SOLN
1.0000 mg | INTRAMUSCULAR | Status: DC | PRN
  Administered 2019-06-21 – 2019-06-22 (×3): 1 mg via INTRAVENOUS
  Filled 2019-06-21 (×3): qty 1

## 2019-06-21 MED ORDER — MORPHINE SULFATE (PF) 4 MG/ML IV SOLN
4.0000 mg | INTRAVENOUS | Status: DC | PRN
  Administered 2019-06-21 – 2019-06-22 (×4): 4 mg via INTRAVENOUS
  Filled 2019-06-21 (×4): qty 1

## 2019-06-21 MED ORDER — FENTANYL 50 MCG/HR TD PT72
1.0000 | MEDICATED_PATCH | TRANSDERMAL | Status: DC
  Administered 2019-06-21 – 2019-06-24 (×2): 1 via TRANSDERMAL
  Filled 2019-06-21 (×2): qty 1

## 2019-06-21 NOTE — Progress Notes (Signed)
Unfortunately, I just do not see a lot of improvement with Mr. Blood.  Despite the improvement in the hypercalcemia, his mental status still quite confused.  Somehow, he thinks that he is in [place].  He thinks he is having surgery.  I know the CT of the brain did not show any brain metastasis.  It is possible what we are looking at might be a paraneoplastic type affect.  Hopefully, the pathology will be out today.  At least a preliminary report that we will tell us what this is a carcinoma or a lymphoma.  I will try him on a fentanyl patch.  We will reorder the Ativan.  His labs show white cell count of 16.  Hemoglobin 9.6.  Platelet count 340,000.  LDH is 260.  Calcium is 10.2 with an albumin of 3.2.  His creatinine is 1.11.  I am not sure he is really eating all that much.  We will get palliative care involved now.  I think they might be able to help [place] with his symptom management, particularly the pain issues.  Depending on the pathology, we will probably need to get Hospice involved.  His vital signs show temperature of 98.4.  Pulse 102.  Blood pressure 145/82.  His lungs are relatively clear.  Cardiac exam tachycardic but regular.  Abdomen is soft.  Bowel sounds are somewhat decreased.  Extremities he does move his extremities.  Muscle atrophy in upper and lower extremities.  Neurological exam shows no focal deficits outside of the altered mental state.  I have to believe that Mark Tanner has a carcinoma.  I suspect this is going to be renal cell carcinoma.  He has extensive metastatic disease.  Again, I just do not see that he is going to be a candidate for any therapy unless this is lymphoma which I would be surprised with.  We are clearly dealing with quality of life issues.  I will have to talk to his wife regarding end-of-life issues.  I will do this once I have a pathologic diagnosis.  I very much appreciate the outstanding care that he is getting from all the staff up on  6 [address].  I know this is a quite challenging situation.  Lattie Haw, MD  Psalm 37:5

## 2019-06-21 NOTE — Plan of Care (Signed)
  Problem: Safety: Goal: Ability to remain free from injury will improve Outcome: Progressing   Problem: Skin Integrity: Goal: Risk for impaired skin integrity will decrease Outcome: Progressing   Problem: Pain Managment: Goal: General experience of comfort will improve Outcome: Not Progressing

## 2019-06-22 ENCOUNTER — Encounter: Payer: Self-pay | Admitting: *Deleted

## 2019-06-22 DIAGNOSIS — Z7189 Other specified counseling: Secondary | ICD-10-CM

## 2019-06-22 DIAGNOSIS — Z515 Encounter for palliative care: Secondary | ICD-10-CM

## 2019-06-22 DIAGNOSIS — R569 Unspecified convulsions: Secondary | ICD-10-CM

## 2019-06-22 DIAGNOSIS — C801 Malignant (primary) neoplasm, unspecified: Secondary | ICD-10-CM

## 2019-06-22 LAB — CBC WITH DIFFERENTIAL/PLATELET
Abs Immature Granulocytes: 0.3 10*3/uL — ABNORMAL HIGH (ref 0.00–0.07)
Basophils Absolute: 0.1 10*3/uL (ref 0.0–0.1)
Basophils Relative: 1 %
Eosinophils Absolute: 0.2 10*3/uL (ref 0.0–0.5)
Eosinophils Relative: 1 %
HCT: 29.5 % — ABNORMAL LOW (ref 39.0–52.0)
Hemoglobin: 9.8 g/dL — ABNORMAL LOW (ref 13.0–17.0)
Immature Granulocytes: 2 %
Lymphocytes Relative: 7 %
Lymphs Abs: 1.1 10*3/uL (ref 0.7–4.0)
MCH: 29.9 pg (ref 26.0–34.0)
MCHC: 33.2 g/dL (ref 30.0–36.0)
MCV: 89.9 fL (ref 80.0–100.0)
Monocytes Absolute: 1.5 10*3/uL — ABNORMAL HIGH (ref 0.1–1.0)
Monocytes Relative: 10 %
Neutro Abs: 12.4 10*3/uL — ABNORMAL HIGH (ref 1.7–7.7)
Neutrophils Relative %: 79 %
Platelets: 391 10*3/uL (ref 150–400)
RBC: 3.28 MIL/uL — ABNORMAL LOW (ref 4.22–5.81)
RDW: 14 % (ref 11.5–15.5)
WBC: 15.6 10*3/uL — ABNORMAL HIGH (ref 4.0–10.5)
nRBC: 0 % (ref 0.0–0.2)

## 2019-06-22 LAB — LACTATE DEHYDROGENASE: LDH: 279 U/L — ABNORMAL HIGH (ref 98–192)

## 2019-06-22 LAB — COMPREHENSIVE METABOLIC PANEL
ALT: 14 U/L (ref 0–44)
AST: 16 U/L (ref 15–41)
Albumin: 3.5 g/dL (ref 3.5–5.0)
Alkaline Phosphatase: 67 U/L (ref 38–126)
Anion gap: 9 (ref 5–15)
BUN: 45 mg/dL — ABNORMAL HIGH (ref 6–20)
CO2: 25 mmol/L (ref 22–32)
Calcium: 10.3 mg/dL (ref 8.9–10.3)
Chloride: 100 mmol/L (ref 98–111)
Creatinine, Ser: 1.07 mg/dL (ref 0.61–1.24)
GFR calc Af Amer: >60 mL/min (ref >60)
GFR calc non Af Amer: >60 mL/min (ref >60)
Glucose, Bld: 96 mg/dL (ref 70–99)
Potassium: 4 mmol/L (ref 3.5–5.1)
Sodium: 134 mmol/L — ABNORMAL LOW (ref 135–145)
Total Bilirubin: 0.6 mg/dL (ref 0.3–1.2)
Total Protein: 5.9 g/dL — ABNORMAL LOW (ref 6.5–8.1)

## 2019-06-22 LAB — SURGICAL PATHOLOGY

## 2019-06-22 MED ORDER — LORAZEPAM 2 MG/ML IJ SOLN
1.0000 mg | INTRAMUSCULAR | Status: DC | PRN
  Administered 2019-06-22 – 2019-06-25 (×9): 1 mg via INTRAVENOUS
  Filled 2019-06-22 (×9): qty 1

## 2019-06-22 MED ORDER — LEVETIRACETAM 500 MG PO TABS
500.0000 mg | ORAL_TABLET | Freq: Every day | ORAL | Status: DC
  Administered 2019-06-22: 500 mg via ORAL
  Filled 2019-06-22: qty 1

## 2019-06-22 MED ORDER — HYDROMORPHONE HCL 1 MG/ML IJ SOLN
1.0000 mg | INTRAMUSCULAR | Status: DC | PRN
  Administered 2019-06-23 (×2): 2 mg via INTRAVENOUS
  Administered 2019-06-24: 1 mg via INTRAVENOUS
  Administered 2019-06-24 (×2): 2 mg via INTRAVENOUS
  Administered 2019-06-24: 1 mg via INTRAVENOUS
  Administered 2019-06-24: 2 mg via INTRAVENOUS
  Administered 2019-06-24: 1 mg via INTRAVENOUS
  Administered 2019-06-25: 2 mg via INTRAVENOUS
  Administered 2019-06-25 (×2): 1 mg via INTRAVENOUS
  Administered 2019-06-25: 2 mg via INTRAVENOUS
  Filled 2019-06-22: qty 2
  Filled 2019-06-22: qty 1
  Filled 2019-06-22 (×2): qty 2
  Filled 2019-06-22: qty 1
  Filled 2019-06-22: qty 2
  Filled 2019-06-22 (×3): qty 1
  Filled 2019-06-22 (×3): qty 2

## 2019-06-22 MED ORDER — MORPHINE SULFATE (CONCENTRATE) 10 MG/0.5ML PO SOLN
10.0000 mg | ORAL | Status: DC | PRN
  Administered 2019-06-22 – 2019-06-24 (×6): 10 mg via ORAL
  Filled 2019-06-22 (×6): qty 0.5

## 2019-06-22 NOTE — Progress Notes (Signed)
Looks like we are dealing with a very high-grade carcinoma.  I spoke to pathology yesterday.  They said this is a very aggressive appearing cancer.  They feel that is urothelial in origin.  This certainly would not surprise me.  Unfortunately, given this information, I just do not think that we are going to be able to do anything for him to try to help the cancer.  I think that we are now in a situation where we have to look at comfort care.  I did speak to his wife by phone this morning.  I let her know about the diagnosis.  She really was not surprised.  We are going to have to get him to hospice home.  She just is not able to care for him at home.  They live in [place].  [facility] would be easiest for them.  Apparently, she says that she will have to pay over $100 a day for him to go there.  The absolutely have no money to pay for this.  I am surprised that it costs money for patient to stay there.  His prognosis clearly is going to be 2-3 weeks.  This is a very aggressive malignancy that we will continue to wear on his body.  I know that he is eaten all that much.  I do still think that his heart is going be able to withstand the strain that the cancer places upon it.  Given the high-grade malignancy, he is now a DO NOT RESUSCITATE.  There is absolutely no indication or no benefit for keeping him alive on a machine.  He would never survive this.  He would never come off life support.  He is now on a fentanyl patch.  He seemed to be a little bit more comfortable.  There is still some confusion.  I think this likely will always be the case.  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  His labs look pretty stable.  His calcium is 10.3.  His albumin is 3.5.  His white cell count is 15.6.  Hemoglobin is 9.8.  We will have to try to work on getting him up to the [facility] [place].  We will call them today and  see what the bed situation is like up there.  Again, I will not place a financial burden upon his wife.  She again, cannot afford over $100 a day.  I would like to hope that there would not be a cost for him to go to the [facility] [place].  We will continue to focus on his comfort.  This will be comfort at all cost.  If he does not eat that is okay.  If he sleeps most of the time that also is all right.  I know that the staff on 6 [address]. is doing a great job with him.  I appreciate all their compassion.  Lattie Haw, MD  2 Timothy 4:6-8

## 2019-06-22 NOTE — TOC Initial Note (Addendum)
Transition of Care [facility]) - Initial/Assessment Note    Patient Details  Name: Mark Tanner MRN: [id number] Date of Birth: Sep 14, 1970  [facility] CM/SW Contact:    Lynnell Catalan, RN Phone Number: 06/22/2019, 3:32 PM  Clinical Narrative:                 This CM was contacted by PMT MD about pt needing referral to [facility]. This CM contacted liaison of [facility] to give referral. Pt wife concerned about cost of inpatient hospice care. [facility] liaison to contact wife again to discuss this. TOC awaiting confirmation that pt approved for [facility].  Expected Discharge Plan: [place] Barriers to Discharge: Continued Medical Work up       Activities of Daily Living   ADL Screening (condition at time of admission) Patient's cognitive ability adequate to safely complete daily activities?: No Is the patient deaf or have difficulty hearing?: No Does the patient have difficulty seeing, even when wearing glasses/contacts?: No Does the patient have difficulty concentrating, remembering, or making decisions?: Yes Patient able to express need for assistance with ADLs?: Yes Does the patient have difficulty dressing or bathing?: Yes Independently performs ADLs?: No Communication: Independent Dressing (OT): Independent Grooming: Needs assistance Is this a change from baseline?: Pre-admission baseline Feeding: Independent Bathing: Needs assistance Is this a change from baseline?: Pre-admission baseline Toileting: Needs assistance Is this a change from baseline?: Pre-admission baseline In/Out Bed: Needs assistance Is this a change from baseline?: Pre-admission baseline Walks in Home: Needs assistance Is this a change from baseline?: Pre-admission baseline Does the patient have difficulty walking or climbing stairs?: Yes Weakness of Legs: Both Weakness of Arms/Hands: None     Admission  diagnosis:  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 High blood pressure   . Seizures ([place])    PCP:  Stephens Shire, MD Pharmacy:   [id number] - [place], [place] [address] Phone: [phone] Fax: [phone]  [facility] [address], [place] - [place] [address] [place] [address] Phone: [phone] Fax: [phone]  [facility] [place], [place] [address] [place] [address] Phone: [phone] Fax: [phone]     Social Determinants of Health (SDOH) Interventions    Readmission Risk Interventions No flowsheet data found.

## 2019-06-22 NOTE — Consult Note (Signed)
Consultation Note Date: 06/22/2019   Patient Name: Mark Tanner  DOB: July 16, 1970  MRN: [id number]  Age / Sex: 49 y.o., male  PCP: Stephens Shire, MD Referring Physician: Volanda Napoleon, MD  Reason for Consultation: Establishing goals of care, Non pain symptom management and Pain control  HPI/Patient Profile: 49 y.o. male  admitted on 06/17/2019    Clinical Assessment and Goals of Care: 49 year old young gentleman with advanced age malignancy-retroperitoneal mass, metastatic adenopathy also found to have liver/lung lesions.  Also admitted with renal failure, hypercalcemia of malignancy and anemia.  Patient is being followed closely and remains under the service of medical oncology, Dr. Marin Olp.  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  At this point in time, efforts are being made to procure transition to hospice facility environment.  Efforts are also being made for adjustment of opioids and nonopioids for pain and nonpain symptom management.  Palliative consult has been requested to continue this plan of care.  Mark Tanner is restless in bed.  He complains of back discomfort.  He appears anxious.  Discussed with bedside RN.  No family present at bedside.  Chart reviewed thoroughly.  Call placed and discussed with Alinda Sierras, colleague from transitions of care as well.  See below.  NEXT OF KIN Wife  SUMMARY OF RECOMMENDATIONS    I have taken the liberty of opioid rotating Mark Tanner from Morphine to IV Dilaudid PRN for hopefully better control of his pain. Additionally, will change his Ativan IV PRN to be available a little more frequently as well. Hopefully, we can get him a little more comfortable.  Call placed and discussed with CSW Alinda Sierras. Discussed with her about patient's current condition, suspected limited prognosis and regarding  barriers to residential hospice. She will look into it and also discuss with wife as well as the hospice agency.  Agree with Dr Antonieta Pert plan to continue with current comfort measures and current hospitalization for the patient's ongoing symptoms and serious incurable illness. Thank you for the consult.  Code Status/Advance Care Planning:  DNR    Symptom Management:   As above  Palliative Prophylaxis:   Delirium Protocol  Additional Recommendations (Limitations, Scope, Preferences):  Full Comfort Care  Psycho-social/Spiritual:   Desire for further Chaplaincy support:yes  Additional Recommendations: Education on Hospice  Prognosis:   < ? 2 weeks  Discharge Planning: Hospice facility      Primary Diagnoses: Present on Admission: **None**   I have reviewed the medical record, interviewed the patient and family, and examined the patient. The following aspects are pertinent.  Past Medical History:  Diagnosis Date  . High blood pressure   . 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 Seizures ([place])    Social History   Socioeconomic History  . Marital status: Married    Spouse name: [name]  . Number of children: 0  . Years of education:  12  . Highest education level: Not on file  Occupational History    Employer: [employer];  Tobacco Use  . Smoking status: Current Every Day Smoker    Packs/day: 1.00    Types: Cigarettes  . Smokeless tobacco: Never Used  Substance and Sexual Activity  . Alcohol use: Yes    Alcohol/week: 1.0 standard drinks    Types: 1 Cans of beer per week  . Drug use: No  . Sexual activity: Not on file  Other Topics Concern  . Not on file  Social History Narrative   Patient is married [name]) and lives with his wife.   Patient works full-time.   Patient has a high school education.   Patient is right-handed.   Patient drinks three  caffeine drinks daily.   Social Determinants of Health   Financial Resource Strain:   . Difficulty of Paying Living Expenses: Not on file  Food Insecurity:   . Worried About Charity fundraiser in the Last Year: Not on file  . Ran Out of Food in the Last Year: Not on file  Transportation Needs:   . Lack of Transportation (Medical): Not on file  . Lack of Transportation (Non-Medical): Not on file  Physical Activity:   . Days of Exercise per Week: Not on file  . Minutes of Exercise per Session: Not on file  Stress:   . Feeling of Stress : Not on file  Social Connections:   . Frequency of Communication with Friends and Family: Not on file  . Frequency of Social Gatherings with Friends and Family: Not on file  . Attends Religious Services: Not on file  . Active Member of Clubs or Organizations: Not on file  . Attends Archivist Meetings: Not on file  . Marital Status: Not on file   Family History  Problem Relation Age of Onset  . Cancer - Colon Father    Scheduled Meds: . enoxaparin (LOVENOX) injection  40 mg Subcutaneous Q24H  . famotidine  20 mg Oral BID  . fentaNYL  1 patch Transdermal Q72H  . levETIRAcetam  500 mg Oral Daily   Continuous Infusions: . sodium chloride 20 mL/hr at 06/22/19 0840   PRN Meds:.HYDROmorphone (DILAUDID) injection, LORazepam, morphine CONCENTRATE, sodium chloride flush Medications Prior to Admission:  Prior to Admission medications   Medication Sig Start Date End Date Taking? Authorizing Provider  HYDROcodone-acetaminophen (NORCO) 10-325 MG tablet Take 1 tablet by mouth every 6 (six) hours as needed for moderate pain or severe pain.   Yes [provider]  levETIRAcetam (KEPPRA) 500 MG tablet Take 1 tablet (500 mg total) by mouth 2 (two) times daily. 05/01/16  Yes Marcial Pacas, MD  loratadine (CLARITIN) 10 MG tablet Take 10 mg by mouth daily as needed for allergies.    Yes [provider]  olmesartan (BENICAR) 40 MG tablet  Take 20 mg by mouth daily.   Yes [provider]  omeprazole (PRILOSEC) 40 MG capsule Take 40 mg by mouth daily. 01/20/19  Yes [provider]  traZODone (DESYREL) 100 MG tablet Take 100-150 mg by mouth at bedtime as needed for sleep.  05/06/19  Yes [provider]   No Known Allergies Review of Systems +agitation +uncontrolled pain  Physical Exam Appears pale and weak Regular work of breathing S1-S2 Abdomen is not distended No edema Muscle wasting +  Vital Signs: BP (!) 152/85 (BP Location: Right Arm)   Pulse (!) 104   Temp 97.7 F (36.5  C) (Oral)   Resp 15   Ht 5\' 10"  (1.778 m)   Wt 62.3 kg   SpO2 98%   BMI 19.71 kg/m  Pain Scale: 0-10 POSS *See Group Information*: 1-Acceptable,Awake and alert Pain Score: Asleep   SpO2: SpO2: 98 % O2 Device:SpO2: 98 % O2 Flow Rate: .O2 Flow Rate (L/min): 2 L/min  IO: Intake/output summary: No intake or output data in the 24 hours ending 06/22/19 1016  LBM: Last BM Date: 06/20/19 Baseline Weight: Weight: 62.3 kg Most recent weight: Weight: 62.3 kg     Palliative Assessment/Data:   PPS 40%  Time In:  9 Time Out:10   Time Total: 60   Greater than 50%  of this time was spent counseling and coordinating care related to the above assessment and plan.  Signed by: Loistine Chance, MD   Please contact Palliative Medicine Team phone at [phone] for questions and concerns.  For individual provider: See Shea Evans

## 2019-06-22 NOTE — Progress Notes (Signed)
Spoke with Dr. Antonieta Pert office regarding Ativan order.  Instructions state to give on call to MRI.  Office staff stated that the Ativan can be given as ordered for anxiety/agitation and instruction regarding MRI can be disregarded.

## 2019-06-22 NOTE — Plan of Care (Signed)
  Problem: Pain Managment: Goal: General experience of comfort will improve Outcome: Progressing   Problem: Safety: Goal: Ability to remain free from injury will improve Outcome: Progressing   Problem: Skin Integrity: Goal: Risk for impaired skin integrity will decrease Outcome: Progressing   

## 2019-06-23 ENCOUNTER — Encounter (HOSPITAL_COMMUNITY): Payer: Self-pay | Admitting: Hematology & Oncology

## 2019-06-23 DIAGNOSIS — Z7189 Other specified counseling: Secondary | ICD-10-CM

## 2019-06-23 DIAGNOSIS — R64 Cachexia: Secondary | ICD-10-CM

## 2019-06-23 DIAGNOSIS — Z515 Encounter for palliative care: Secondary | ICD-10-CM

## 2019-06-23 LAB — CBC WITH DIFFERENTIAL/PLATELET
Abs Immature Granulocytes: 0.33 10*3/uL — ABNORMAL HIGH (ref 0.00–0.07)
Basophils Absolute: 0.1 10*3/uL (ref 0.0–0.1)
Basophils Relative: 1 %
Eosinophils Absolute: 0.2 10*3/uL (ref 0.0–0.5)
Eosinophils Relative: 1 %
HCT: 29.5 % — ABNORMAL LOW (ref 39.0–52.0)
Hemoglobin: 9.4 g/dL — ABNORMAL LOW (ref 13.0–17.0)
Immature Granulocytes: 2 %
Lymphocytes Relative: 8 %
Lymphs Abs: 1.1 10*3/uL (ref 0.7–4.0)
MCH: 29.2 pg (ref 26.0–34.0)
MCHC: 31.9 g/dL (ref 30.0–36.0)
MCV: 91.6 fL (ref 80.0–100.0)
Monocytes Absolute: 1.5 10*3/uL — ABNORMAL HIGH (ref 0.1–1.0)
Monocytes Relative: 10 %
Neutro Abs: 11.2 10*3/uL — ABNORMAL HIGH (ref 1.7–7.7)
Neutrophils Relative %: 78 %
Platelets: 400 10*3/uL (ref 150–400)
RBC: 3.22 MIL/uL — ABNORMAL LOW (ref 4.22–5.81)
RDW: 14.1 % (ref 11.5–15.5)
WBC: 14.3 10*3/uL — ABNORMAL HIGH (ref 4.0–10.5)
nRBC: 0 % (ref 0.0–0.2)

## 2019-06-23 LAB — COMPREHENSIVE METABOLIC PANEL
ALT: 14 U/L (ref 0–44)
AST: 17 U/L (ref 15–41)
Albumin: 3.5 g/dL (ref 3.5–5.0)
Alkaline Phosphatase: 66 U/L (ref 38–126)
Anion gap: 10 (ref 5–15)
BUN: 41 mg/dL — ABNORMAL HIGH (ref 6–20)
CO2: 22 mmol/L (ref 22–32)
Calcium: 9.8 mg/dL (ref 8.9–10.3)
Chloride: 102 mmol/L (ref 98–111)
Creatinine, Ser: 0.92 mg/dL (ref 0.61–1.24)
GFR calc Af Amer: >60 mL/min (ref >60)
GFR calc non Af Amer: >60 mL/min (ref >60)
Glucose, Bld: 93 mg/dL (ref 70–99)
Potassium: 4 mmol/L (ref 3.5–5.1)
Sodium: 134 mmol/L — ABNORMAL LOW (ref 135–145)
Total Bilirubin: 0.7 mg/dL (ref 0.3–1.2)
Total Protein: 6.2 g/dL — ABNORMAL LOW (ref 6.5–8.1)

## 2019-06-23 LAB — LACTATE DEHYDROGENASE: LDH: 353 U/L — ABNORMAL HIGH (ref 98–192)

## 2019-06-23 MED ORDER — LIP MEDEX EX OINT
TOPICAL_OINTMENT | CUTANEOUS | Status: AC
  Filled 2019-06-23: qty 7

## 2019-06-23 NOTE — Progress Notes (Signed)
Overall, Mark Tanner is about the same.  His mental status is really no better.  He has some confusion.  I do not think he is eating all that much.  I very much appreciate the help from palliative care to help with his pain.  We are trying hard to get him up to the [facility] [place].  This will really be the best option for him.  His wife just cannot care for him at home.  Hopefully, this is not can be an issue financially for the family.  His labs today show white cell count of 14.3.  Hemoglobin 9.4.  Platelet count 42,000.  His calcium is 9.8 with an albumin of 3.5.  His glucose is 93.  Creatinine 0.92.  Our goal clearly is comfort care at all cost.  His vital signs are temperature 97.5.  Pulse 105.  Blood pressure 152/89.  His lungs are relatively clear.  Oral exam shows a markedly dry oral mucosa.  He has some dry skin.  There is no thrush.  Cardiac exam regular rate and rhythm.  Abdomen is soft.  Bowel sounds are decreased.  Extremities shows muscle atrophy in upper and lower extremities.  Again, Mr. Gustason has highly aggressive and incredibly advanced poorly differentiated carcinoma-likely renal cell carcinoma.  There might be a sarcoma component to this.  We are trying to get him up to the [facility] in [place].  I know that social work will help with this.  Again the real issue is trying to pay for this.  I just was not aware that there was a charge for having the patient up at the [facility].  We will try to narrow down his medicines little bit more.  I will get him off the [place].  I will have to talk to his wife later today and give her an update.  Hopefully, we will be able to move him in the next day or so.  Lattie Haw, MD  [place] 6:9

## 2019-06-23 NOTE — Progress Notes (Signed)
Daily Progress Note   Patient Name: Mark Tanner       Date: 06/23/2019 DOB: December 03, 1970  Age: 49 y.o. MRN#: [id number] Attending Physician: Volanda Napoleon, MD Primary Care Physician: Stephens Shire, MD Admit Date: 06/17/2019  Reason for Consultation/Follow-up: Establishing goals of care  Subjective: Patient is restless, has discontinued his IV and is trying to undo the bottoms of his gown.  He laughs a little and is not oriented.  His wife is present at the bedside.  Length of Stay: 6  Current Medications: Scheduled Meds:  . enoxaparin (LOVENOX) injection  40 mg Subcutaneous Q24H  . famotidine  20 mg Oral BID  . fentaNYL  1 patch Transdermal Q72H    Continuous Infusions: . sodium chloride 20 mL/hr at 06/22/19 0840    PRN Meds: HYDROmorphone (DILAUDID) injection, LORazepam, morphine CONCENTRATE, sodium chloride flush  Physical Exam          Vital Signs: BP (!) 153/110 (BP Location: Right Arm) Comment: RN notified  Pulse (!) 111   Temp 97.6 F (36.4 C) (Oral)   Resp 16   Ht 5\' 10"  (1.778 m)   Wt 62.3 kg   SpO2 100%   BMI 19.71 kg/m  SpO2: SpO2: 100 % O2 Device: O2 Device: Room Air O2 Flow Rate: O2 Flow Rate (L/min): 2 L/min  Intake/output summary:   Intake/Output Summary (Last 24 hours) at 06/23/2019 1638 Last data filed at 06/23/2019 0930 Gross per 24 hour  Intake 636.67 ml  Output --  Net 636.67 ml   LBM: Last BM Date: 06/20/19 Baseline Weight: Weight: 62.3 kg Most recent weight: Weight: 62.3 kg       Palliative Assessment/Data:    Flowsheet Rows     Most Recent Value  Intake Tab  Referral Department  Oncology  Unit at Time of Referral  Oncology Unit  Palliative Care Primary Diagnosis  Cancer  Date Notified  06/21/19  Palliative Care Type  New  Palliative care  Reason for referral  Pain, Clarify Goals of Care, Non-pain Symptom  Date of Admission  06/17/19  Date first seen by Palliative Care  06/21/19  # of days Palliative referral response time  0 Day(s)  # of days IP prior to Palliative referral  4  Clinical Assessment  Psychosocial & Spiritual Assessment  Palliative Care  Outcomes      Patient Active Problem List   Diagnosis Date Noted  . 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 Malignant cachexia ([place]) 06/17/2019  . 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 High blood pressure   . Seizures [facility])     Palliative Care Assessment & Plan   Patient Profile:    Assessment: 49 year old gentleman with advanced stage malignancy-retroperitoneal mass, metastatic adenopathy also found to have liver/lung lesions.  Admitted with renal failure hypercalcemia of malignancy and anemia. Ongoing decline, anxiety and restlessness Generalized pain  Recommendations/Plan:  Continue current pain and nonpain symptom management  Appreciate transitions of care assistance with hospice consultation.  Discussed with wife [name] at the bedside about the patient's current symptom management.  She is appreciative of the care the patient is receiving here in the hospital.  Hopefully patient can be discharged to hospice facility soon.  Goals of Care and Additional Recommendations:  Limitations on Scope of Treatment: Full Comfort Care  Code Status:    Code Status Orders  (From admission, onward)         Start     Ordered   06/22/19 0704  Do not attempt resuscitation (DNR)  Continuous    Question Answer Comment  In the event of cardiac or respiratory ARREST Do not call a "code blue"   In the event of cardiac or respiratory ARREST Do not perform Intubation, CPR, defibrillation or ACLS   In the event of cardiac or respiratory ARREST Use  medication by any route, position, wound care, and other measures to relive pain and suffering. May use oxygen, suction and manual treatment of airway obstruction as needed for comfort.      06/22/19 0704        Code Status History    Date Active Date Inactive Code Status Order ID Comments User Context   06/17/2019 1704 06/22/2019 0704 Full Code [id number]  Volanda Napoleon, MD Inpatient   Advance Care Planning Activity       Prognosis:   < 2 weeks  Discharge Planning:  Hospice facility  Care plan was discussed with  Patient and wife   Thank you for allowing the Palliative Medicine Team to assist in the care of this patient.   Time In: 1600 Time Out: 1625 Total Time 25 Prolonged Time Billed  no       Greater than 50%  of this time was spent counseling and coordinating care related to the above assessment and plan.  Loistine Chance, MD  Please contact Palliative Medicine Team phone at [phone] for questions and concerns.

## 2019-06-23 NOTE — Progress Notes (Signed)
Called back Wife of patient (patricia) to provide  Update per wife's request. Patients wife didn't answer left message to call back nurse

## 2019-06-24 DIAGNOSIS — C180 Malignant neoplasm of cecum: Secondary | ICD-10-CM

## 2019-06-24 LAB — CREATININE, SERUM
Creatinine, Ser: 1 mg/dL (ref 0.61–1.24)
GFR calc Af Amer: >60 mL/min (ref >60)
GFR calc non Af Amer: >60 mL/min (ref >60)

## 2019-06-24 LAB — LACTATE DEHYDROGENASE: LDH: 460 U/L — ABNORMAL HIGH (ref 98–192)

## 2019-06-24 MED ORDER — LIP MEDEX EX OINT
TOPICAL_OINTMENT | CUTANEOUS | Status: DC | PRN
  Filled 2019-06-24: qty 7

## 2019-06-24 MED ORDER — LIP MEDEX EX OINT
TOPICAL_OINTMENT | CUTANEOUS | Status: AC
  Filled 2019-06-24: qty 7

## 2019-06-24 NOTE — TOC Progression Note (Signed)
Transition of Care [facility]) - Progression Note    Patient Details  Name: Mark Tanner MRN: [id number] Date of Birth: 1971-01-21  [facility]) CM/SW Contact  Autumnrose Yore, Marjie Skiff, RN Phone Number: 06/24/2019, 12:25 PM  Clinical Narrative:    This CM is continuing to work with pt wife and [facility] on [place]. There has been financial circumstances which have created barriers to [place] accepting pt into their inpt hospice home. TOC will continue to follow and assist as needed.   Expected Discharge Plan: [place] Barriers to Discharge: Continued Medical Work up  Expected Discharge Plan and Services Expected Discharge Plan: [place]                                               Social Determinants of Health (SDOH) Interventions    Readmission Risk Interventions No flowsheet data found.

## 2019-06-24 NOTE — Progress Notes (Signed)
Daily Progress Note   Patient Name: Mark Tanner       Date: 06/24/2019 DOB: 1970/09/07  Age: 49 y.o. MRN#: [id number] Attending Physician: Volanda Napoleon, MD Primary Care Physician: Stephens Shire, MD Admit Date: 06/17/2019  Reason for Consultation/Follow-up: Establishing goals of care  Subjective: Patient is resting in bed this morning, less alert this morning than yesterday. No family at bedside.    Length of Stay: 7  Current Medications: Scheduled Meds:  . fentaNYL  1 patch Transdermal Q72H  . lip balm        Continuous Infusions: . sodium chloride 20 mL/hr at 06/24/19 0520    PRN Meds: HYDROmorphone (DILAUDID) injection, lip balm, LORazepam, morphine CONCENTRATE, sodium chloride flush  Physical Exam         Frail young gentleman No distress Regular work of breathing Muscle wasting No edema Abdomen not distended  Vital Signs: BP (!) 150/97 (BP Location: Right Arm)   Pulse (!) 102   Temp 97.9 F (36.6 C) (Oral)   Resp 15   Ht 5\' 10"  (1.778 m)   Wt 62.3 kg   SpO2 98%   BMI 19.71 kg/m  SpO2: SpO2: 98 % O2 Device: O2 Device: Room Air O2 Flow Rate: O2 Flow Rate (L/min): 2 L/min  Intake/output summary:   Intake/Output Summary (Last 24 hours) at 06/24/2019 1155 Last data filed at 06/24/2019 1008 Gross per 24 hour  Intake 280 ml  Output 350 ml  Net -70 ml   LBM: Last BM Date: 06/20/19 Baseline Weight: Weight: 62.3 kg Most recent weight: Weight: 62.3 kg       Palliative Assessment/Data:    Flowsheet Rows     Most Recent Value  Intake Tab  Referral Department  Oncology  Unit at Time of Referral  Oncology Unit  Palliative Care Primary Diagnosis  Cancer  Date Notified  06/21/19  Palliative Care Type  New Palliative care  Reason for referral  Pain,  Clarify Goals of Care, Non-pain Symptom  Date of Admission  06/17/19  Date first seen by Palliative Care  06/21/19  # of days Palliative referral response time  0 Day(s)  # of days IP prior to Palliative referral  4  Clinical Assessment  Psychosocial & Spiritual Assessment  Palliative Care Outcomes      Patient Active  Problem List   Diagnosis Date Noted  . Palliative care by specialist   . Goals of care, counseling/discussion   . 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 Malignant cachexia ([place]) 06/17/2019  . 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 High blood pressure   . Seizures [facility])     Palliative Care Assessment & Plan   Patient Profile:    Assessment: 49 year old gentleman with advanced stage malignancy-retroperitoneal mass, metastatic adenopathy also found to have liver/lung lesions.  Admitted with renal failure hypercalcemia of malignancy and anemia. Ongoing decline, anxiety and restlessness Generalized pain  Recommendations/Plan:  Continue current pain and nonpain symptom management  Appreciate transitions of care assistance with hospice consultation, await hospice facility recommendations.   On 2-17, I discussed with wife [name] at the bedside about the patient's current symptom management.  She is appreciative of the care the patient is receiving here in the hospital.  Hopefully patient can be discharged to hospice facility soon.  Goals of Care and Additional Recommendations:  Limitations on Scope of Treatment: Full Comfort Care  Code Status:    Code Status Orders  (From admission, onward)         Start     Ordered   06/22/19 0704  Do not attempt resuscitation (DNR)  Continuous    Question Answer Comment  In the event of cardiac or respiratory ARREST Do not call a "code blue"   In the event of cardiac or respiratory ARREST Do not perform Intubation,  CPR, defibrillation or ACLS   In the event of cardiac or respiratory ARREST Use medication by any route, position, wound care, and other measures to relive pain and suffering. May use oxygen, suction and manual treatment of airway obstruction as needed for comfort.      06/22/19 0704        Code Status History    Date Active Date Inactive Code Status Order ID Comments User Context   06/17/2019 1704 06/22/2019 0704 Full Code [id number]  Volanda Napoleon, MD Inpatient   Advance Care Planning Activity       Prognosis:   < 2 weeks  Discharge Planning:  Hospice facility  Care plan was discussed with  IDT  Thank you for allowing the Palliative Medicine Team to assist in the care of this patient.   Time In: 11 Time Out: 11.15 Total Time 15 Prolonged Time Billed  no       Greater than 50%  of this time was spent counseling and coordinating care related to the above assessment and plan.  Loistine Chance, MD  Please contact Palliative Medicine Team phone at [phone] for questions and concerns.

## 2019-06-24 NOTE — Progress Notes (Signed)
Overall, Mr. Everard is really about the same.  He does talk a little bit.  A lot of times is harder know what he is saying.  He might be a little bit more oriented.  Again, we are trying to get him to [facility] in [place].  I am not sure where we stand with this.  Hopefully we can get him there.  I do not want this to be a financial burden for his family.  I know they have no insurance.  I hope that the Hospice Home will be able to take him and not charge for a daily room rate.  It is hard to say if he is really eating that much.  We do not have any labs on him today.  However, the LDH is going up which I will have to believe is reflective of the cancer.  Comfort care is our primary goal.  I appreciate the help from palliative care.  His vital signs all look pretty stable still.  He is afebrile.  His pulse is 108.  Blood pressure 139/88.  There is really no change in his physical exam.  Again, our goal is to get into the [facility] in [place].  I know that social work is trying hard to get him up there and again not have big cost factor for his family.  I know that his wife just cannot take care of him at home.  I am called her this morning.  I gave her an update.  I very much appreciate the outstanding care that he is getting from all the staff upon [address].  Lattie Haw, MD  Philippians 4:13

## 2019-06-25 ENCOUNTER — Other Ambulatory Visit: Payer: Self-pay | Admitting: Family

## 2019-06-25 LAB — CBC WITH DIFFERENTIAL/PLATELET
Abs Immature Granulocytes: 0.44 10*3/uL — ABNORMAL HIGH (ref 0.00–0.07)
Basophils Absolute: 0.1 10*3/uL (ref 0.0–0.1)
Basophils Relative: 1 %
Eosinophils Absolute: 0.2 10*3/uL (ref 0.0–0.5)
Eosinophils Relative: 1 %
HCT: 28.6 % — ABNORMAL LOW (ref 39.0–52.0)
Hemoglobin: 9.3 g/dL — ABNORMAL LOW (ref 13.0–17.0)
Immature Granulocytes: 3 %
Lymphocytes Relative: 9 %
Lymphs Abs: 1.4 10*3/uL (ref 0.7–4.0)
MCH: 29.2 pg (ref 26.0–34.0)
MCHC: 32.5 g/dL (ref 30.0–36.0)
MCV: 89.9 fL (ref 80.0–100.0)
Monocytes Absolute: 1.7 10*3/uL — ABNORMAL HIGH (ref 0.1–1.0)
Monocytes Relative: 11 %
Neutro Abs: 12.5 10*3/uL — ABNORMAL HIGH (ref 1.7–7.7)
Neutrophils Relative %: 75 %
Platelets: 387 10*3/uL (ref 150–400)
RBC: 3.18 MIL/uL — ABNORMAL LOW (ref 4.22–5.81)
RDW: 14.3 % (ref 11.5–15.5)
WBC: 16.3 10*3/uL — ABNORMAL HIGH (ref 4.0–10.5)
nRBC: 0 % (ref 0.0–0.2)

## 2019-06-25 LAB — RESPIRATORY PANEL BY RT PCR (FLU A&B, COVID)
Influenza A by PCR: NEGATIVE
Influenza B by PCR: NEGATIVE
SARS Coronavirus 2 by RT PCR: NEGATIVE

## 2019-06-25 LAB — COMPREHENSIVE METABOLIC PANEL
ALT: 12 U/L (ref 0–44)
AST: 18 U/L (ref 15–41)
Albumin: 3.3 g/dL — ABNORMAL LOW (ref 3.5–5.0)
Alkaline Phosphatase: 77 U/L (ref 38–126)
Anion gap: 10 (ref 5–15)
BUN: 40 mg/dL — ABNORMAL HIGH (ref 6–20)
CO2: 20 mmol/L — ABNORMAL LOW (ref 22–32)
Calcium: 9.6 mg/dL (ref 8.9–10.3)
Chloride: 106 mmol/L (ref 98–111)
Creatinine, Ser: 0.92 mg/dL (ref 0.61–1.24)
GFR calc Af Amer: >60 mL/min (ref >60)
GFR calc non Af Amer: >60 mL/min (ref >60)
Glucose, Bld: 96 mg/dL (ref 70–99)
Potassium: 3.9 mmol/L (ref 3.5–5.1)
Sodium: 136 mmol/L (ref 135–145)
Total Bilirubin: 0.6 mg/dL (ref 0.3–1.2)
Total Protein: 5.9 g/dL — ABNORMAL LOW (ref 6.5–8.1)

## 2019-06-25 LAB — LACTATE DEHYDROGENASE: LDH: 468 U/L — ABNORMAL HIGH (ref 98–192)

## 2019-06-25 MED ORDER — FENTANYL 50 MCG/HR TD PT72: 1.0000 | MEDICATED_PATCH | TRANSDERMAL | 0 refills | Status: AC

## 2019-06-25 MED ORDER — LORAZEPAM 2 MG/ML PO CONC
1.0000 mg | ORAL | Status: DC | PRN
  Administered 2019-06-25: 1 mg via ORAL
  Filled 2019-06-25: qty 1

## 2019-06-25 MED ORDER — LORAZEPAM 2 MG/ML PO CONC: 1.0000 mg | ORAL | 0 refills | Status: AC | PRN

## 2019-06-25 MED ORDER — MORPHINE SULFATE (CONCENTRATE) 10 MG/0.5ML PO SOLN: 10.0000 mg | ORAL | 0 refills | Status: AC | PRN

## 2019-06-25 NOTE — Discharge Summary (Signed)
Discharge Summary  Patient ID: Mark Tanner MRN: [id number] DOB/AGE: December 21, 1970 49 y.o.  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   Palliative care by specialist   Goals of care, counseling/discussion   Discharged Condition: poor  Discharge Labs:   CBC    Component Value Date/Time   WBC 16.3 (H) 06/25/2019 0420   RBC 3.18 (L) 06/25/2019 0420   HGB 9.3 (L) 06/25/2019 0420   HGB 10.8 (L) 06/17/2019 1409   HCT 28.6 (L) 06/25/2019 0420   PLT 387 06/25/2019 0420   PLT 416 (H) 06/17/2019 1409   MCV 89.9 06/25/2019 0420   MCH 29.2 06/25/2019 0420   MCHC 32.5 06/25/2019 0420   RDW 14.3 06/25/2019 0420   LYMPHSABS 1.4 06/25/2019 0420   MONOABS 1.7 (H) 06/25/2019 0420   EOSABS 0.2 06/25/2019 0420   BASOSABS 0.1 06/25/2019 0420   CMP Latest Ref Rng & Units 06/25/2019 06/24/2019 06/23/2019  Glucose 70 - 99 mg/dL 96 - 93  BUN 6 - 20 mg/dL 40(H) - 41(H)  Creatinine 0.61 - 1.24 mg/dL 0.92 1.00 0.92  Sodium 135 - 145 mmol/L 136 - 134(L)  Potassium 3.5 - 5.1 mmol/L 3.9 - 4.0  Chloride 98 - 111 mmol/L 106 - 102  CO2 22 - 32 mmol/L 20(L) - 22  Calcium 8.9 - 10.3 mg/dL 9.6 - 9.8  Total Protein 6.5 - 8.1 g/dL 5.9(L) - 6.2(L)  Total Bilirubin 0.3 - 1.2 mg/dL 0.6 - 0.7  Alkaline Phos 38 - 126 U/L 77 - 66  AST 15 - 41 U/L 18 - 17  ALT 0 - 44 U/L 12 - 14    Consults: Interventional Radiology and Palliative Care  Procedures:  1.  Ultrasound-guided biopsy of the dominant left supraclavicular lymph node on 06/18/2019.  Pathology results showed high-grade poorly differentiated renal cell carcinoma. 2.  CT of the head with and without contrast performed on 06/18/2019 which showed no evidence of intracranial metastases or acute abnormality.  Disposition: Discharge to  Residential Hospice.    Allergies as of 06/25/2019   No Known Allergies     Medication List    STOP taking these medications   HYDROcodone-acetaminophen 10-325 MG tablet Commonly known as: NORCO   levETIRAcetam 500 MG tablet Commonly known as: KEPPRA   loratadine 10 MG tablet Commonly known as: CLARITIN   olmesartan 40 MG tablet Commonly known as: BENICAR   omeprazole 40 MG capsule Commonly known as: PRILOSEC   traZODone 100 MG tablet Commonly known as: DESYREL     TAKE these medications   fentaNYL 50 MCG/HR Commonly known as: [place] 1 patch onto the skin every 3 (three) days. Start taking on: June 27, 2019   LORazepam 2 MG/ML concentrated solution Commonly known as: ATIVAN Take 0.5 mLs (1 mg total) by mouth every 4 (four) hours as needed for anxiety.   morphine CONCENTRATE 10 MG/0.5ML Soln concentrated solution Take 0.5 mLs (10 mg total) by mouth every 2 (two) hours as needed for moderate pain or shortness of breath.        HPI: Mark Tanner is a 49 year old male who presented to the cancer center for an initial consult on 06/17/2019 due to widespread metastatic cancer seen on CT scan felt to originate in the right kidney.  CT scan showed retroperitoneal  adenopathy, left neck base adenopathy, multiple lung nodules, multiple liver metastatic lesions, and extensive osseous metastatic disease including direct osseous invasion of the retroperitoneal mass.  The patient's wife stated that he was no longer able to stand and was having a great deal of back and pelvic pain along with numbness and tingling in his fingertips and legs.  He was somewhat disoriented during his initial consultation.  He was also having some nausea with dry heaving on and off as well as episodes of dizziness.  Lab work in the office showed significant hypercalcemia and severe dehydration.  He received a dose of IV dexamethasone and morphine for his pain while in the office.  Due to his  declining performance status, his wife could not care for him at home in this condition.  He was directly admitted to [facility] for further evaluation of his widely metastatic cancer and management of his symptoms.  Hospital Course:  1.  Widely metastatic, high-grade, poorly differentiated renal cell carcinoma: The patient underwent ultrasound-guided biopsy of his bulky left cervical lymph node.  Pathology resulted showing a high-grade poorly differentiated renal cell carcinoma.  Treatment was felt not to be beneficial.  His prognosis was estimated to be approximately 2 to 3 weeks.  The patient was made a DNR/DNI. The palliative care team consulted on 06/22/2019 for additional assistance with goals of care discussion and symptom management.  They assisted with management of his pain medication and anxiety medications.  The patient and his family opted to shift to comfort measures and to pursue residential hospice.  A bed was available at the hospice home [facility] on 06/25/2019 and he was deemed stable for discharge to this facility.  2.  Hypercalcemia: The patient was admitted with significant hypercalcemia with a calcium level of 14.9.  He was having some difficulty with altered mental status secondary to his hypercalcemia.  He was aggressively hydrated and given a dose of Zometa 4 mg IV on 06/17/2019.  Calcium level on the day of discharge is 9.6.  3.  Severe hyponatremia: The patient had severe hyponatremia on admission with a sodium of 118.  This was likely due to underlying malignancy as well as dehydration.  Sodium level corrected during his hospitalization and is 136 on the day of discharge.  4.  Dehydration: The patient was severely dehydrated on admission with a BUN of 54 and creatinine 1.66.  With aggressive hydration, his renal function improved.  On the day of discharge, his BUN is 40 and creatinine is 0.92.  5.  Pain secondary to bone metastasis: Appreciate assistance from  the palliative care team.  He is currently on a fentanyl patch of 50 mcg/h which is changed every 72 hours and he used IV Dilaudid as well as oral morphine concentrate and Ativan concentrate.  He will discharge to the facility on the fentanyl patch and will have morphine sulfate concentrate and Ativan elixir available to him.  6.  History of seizure disorder: The patient was previously maintained on Keppra for seizures.  This has now been discontinued.  He has not had recurrent seizures in many years and there was no seizure activity noted during hospitalization.   On the day of discharge, COVID-19 testing was negative.  Out of facility DNR has been signed and will be sent with the patient to hospice home of [facility].  Signed: Mikey Bussing 06/25/2019, 2:30 PM

## 2019-06-25 NOTE — Progress Notes (Signed)
Overall, everything is holding pretty steady for Mark Tanner.  We are still trying to figure out if we can get him up to the [facility].  I try to get hold of the lady who might be in charge of this up there.  I do not think she was in because of the weather situation.  If, for some reason, were not able to get him up to the [facility], then we may just have to see about getting him home.  I just would hate to have to do this to his poor wife.  I am just not sure she will be able to manage him at home.  He is about the same.  He might be a little bit more oriented.  I know that he is eating all that much.  His labs show his white cell count was 16.  Hemoglobin 9.3.  Platelet count 387,000.  LDH is 468.  BUN is 40 creatinine 0.92.  Calcium is 9.6 with an albumin of 3.3.  I think his pain control seems to be doing a little bit better.  I appreciate the help from palliative care.  I know that there doing a fantastic job trying to keep him comfortable.  His vital signs are temperature 98.5.  Pulse 108.  Blood pressure 144/91.  Head neck exam shows no ocular or oral lesions.  He has temporal muscle wasting.  His oral exam shows better oral mucosa.  I think the nurses are doing a great job cleaning his mouth out.  Lungs are with decent breath sounds bilaterally.  Cardiac exam regular rate and rhythm.  Abdomen is soft.  Bowel sounds are slightly decreased.  There may be some slight fullness in the hypoumbilical region.  Extremities shows muscle atrophy in upper and lower extremities.  Neurological exam shows no focal deficits.  [name] has widespread high-grade poorly differentiated renal cell carcinoma.  He is not a candidate for therapy.  We are trying to get him to the [facility] in [place].  Thankfully, he seems to be a little bit more comfortable which is nice to see.  His mental state seems to be improving a little bit also.  I know that he is getting outstanding  care from all the staff up on 6 E.  Lattie Haw, MD  Michaelyn Barter 2:8

## 2019-06-25 NOTE — Progress Notes (Signed)
Report given to Hampton Abbot ,RN at [facility].

## 2019-06-25 NOTE — TOC Transition Note (Addendum)
Transition of Care [facility]) - CM/SW Discharge Note   Patient Details  Name: Mark Tanner MRN: [id number] Date of Birth: 01-12-1971  [facility] Care [facility]) CM/SW Contact:  Lynnell Catalan, RN Phone Number: 06/25/2019, 12:42 PM   Clinical Narrative:     This CM was alerted by [place] that pt wife has signed all papers needed for admission and that they have a bed available today. This CM notified attending office. Rapid covid test ordered and will need to result prior to transport. Awaiting DC orders for transport to facility by [facility].  Addendum: 1500- RN to call report to [phone]. [facility] called for transport

## 2019-07-02 ENCOUNTER — Telehealth: Payer: Self-pay | Admitting: *Deleted

## 2019-07-02 NOTE — Telephone Encounter (Signed)
Call placed to [place] to check on patient's status.  Patient passed away on 07-27-2019 per staff.  Dr. Marin Olp notified.

## 2019-07-05 DEATH — deceased

## 2020-12-29 IMAGING — CT CT CHEST W/ CM
3 series · 13 of 32 positions shown, 16 images · IV contrast (APPLIED)
Comparison: Lumbar MRI, 06/07/2019

CLINICAL DATA: Pt has hx of several months of debilitating lower
back pain, pt is altered and confused at time, unable to recall
personal hx, but does endorse pain, weight loss of more than 64lbs
He is unable to perform daily tasks without assistance no prev sx,
MRI @ [facility] in PACS showed abnormal

EXAM:
CT CHEST, ABDOMEN, AND PELVIS WITH CONTRAST
TECHNIQUE: Multidetector CT imaging of the chest, abdomen and pelvis was
performed following the standard protocol during bolus
administration of intravenous contrast.
CONTRAST:  60mL 332REO-PBB IOPAMIDOL (332REO-PBB) INJECTION 61%

[Series 3: chest/abd/pelvis w/cm · axial · 0.70mm/px · z∈[+714,+1259]mm · 8 of 131 slices shown]
[im 11/131  soft-tissue]
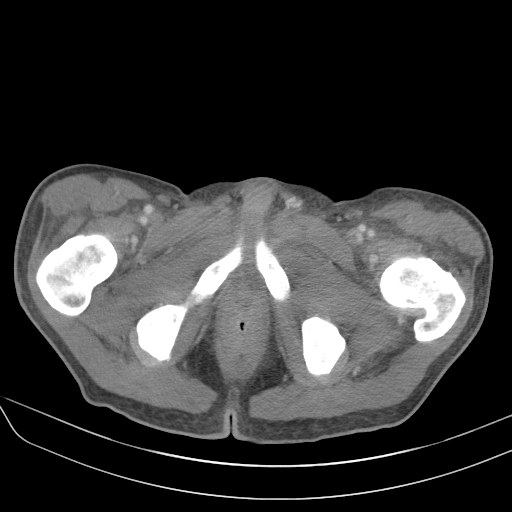
[im 33/131  soft-tissue]
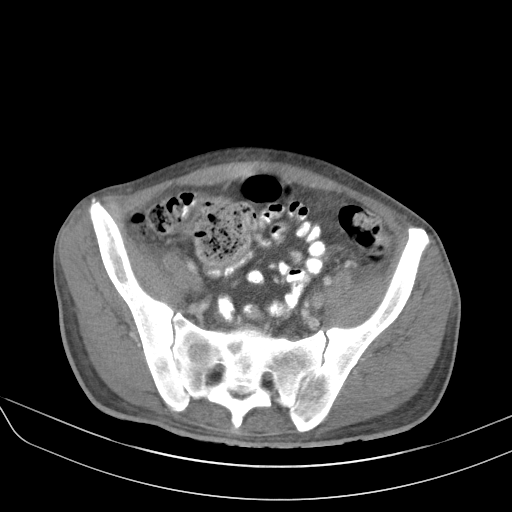
[im 44/131  soft-tissue]
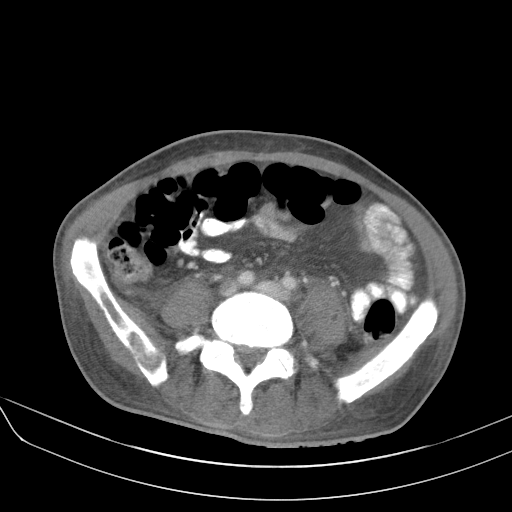
[im 55/131  soft-tissue]
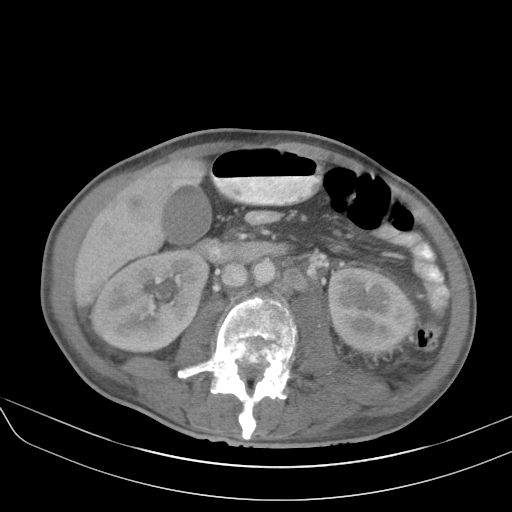
[im 76/131  soft-tissue]
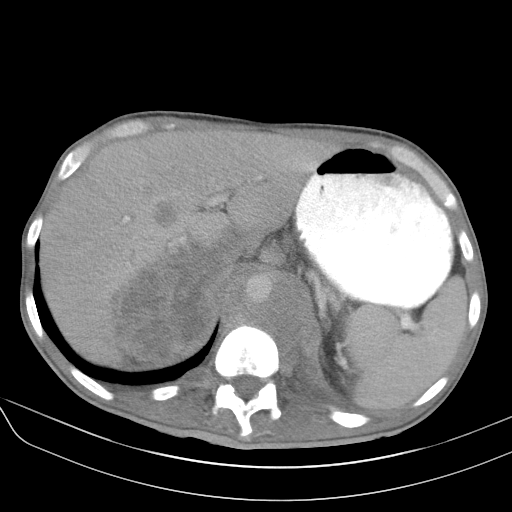
[im 87/131  soft-tissue]
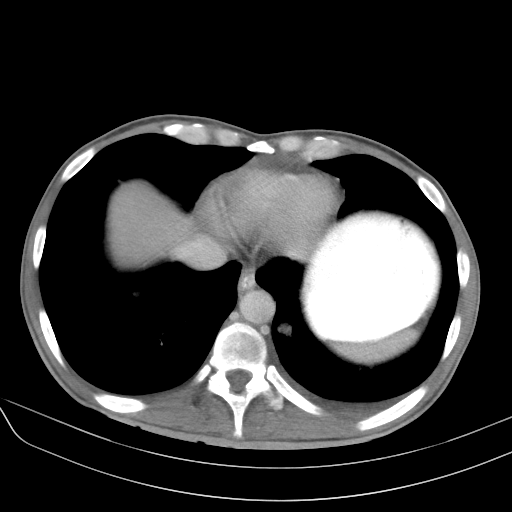
[im 98/131  soft-tissue]
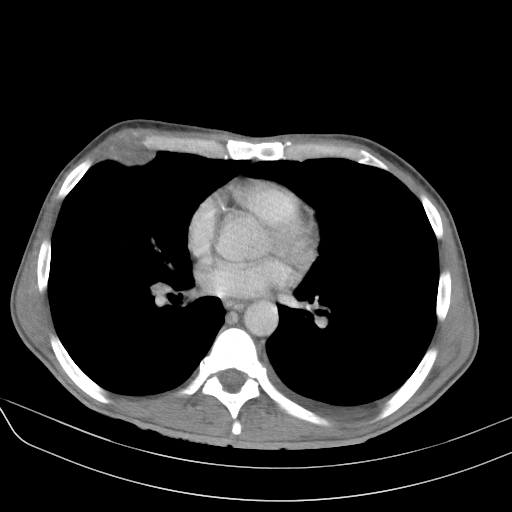
[im 120/131  soft-tissue]
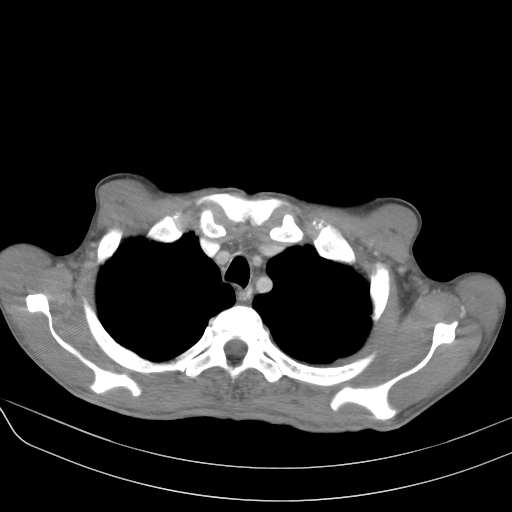

[Series 5: lung · axial · 0.71mm/px · z∈[+1022,+1112]mm · 3 of 158 slices shown]
[im 12/158  bone]
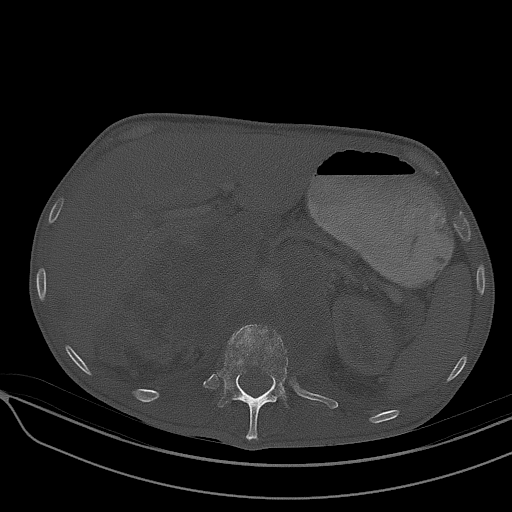
[im 34/158  bone]
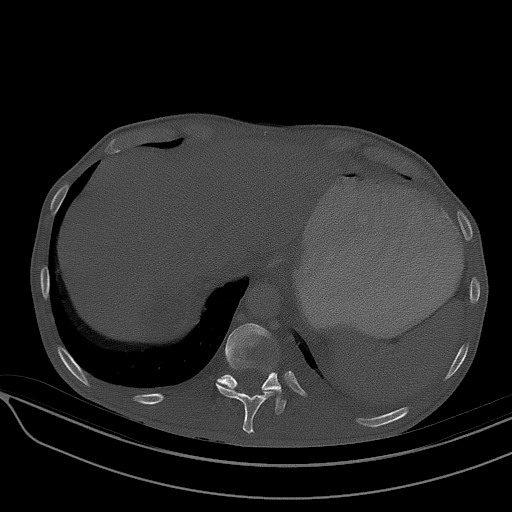
[im 57/158  bone]
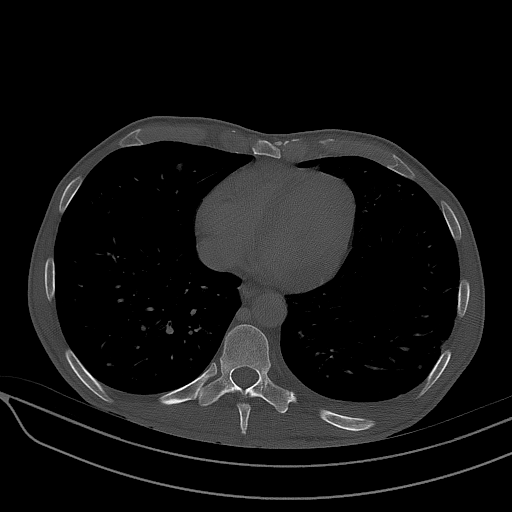

[Series 8: renal delay · axial · delayed · 0.70mm/px · z∈[+949,+1014]mm · 2 of 39 slices shown, 5 images]
[im 13/39  soft-tissue]
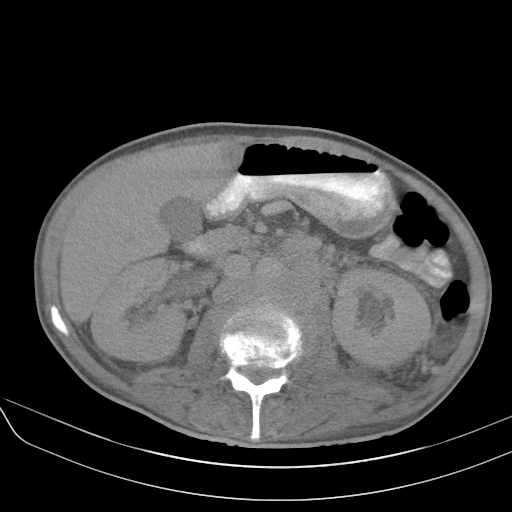
[im 13/39  lung]
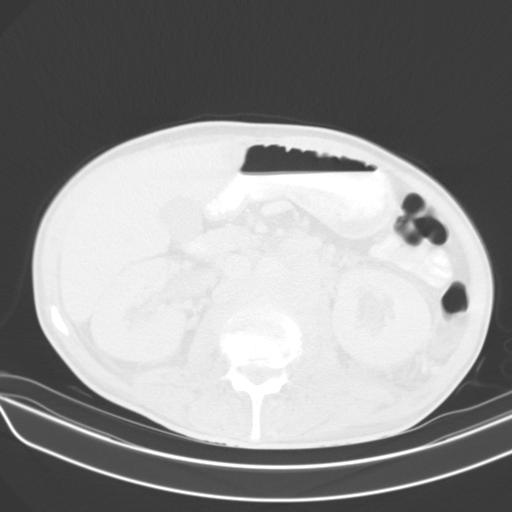
[im 13/39  bone]
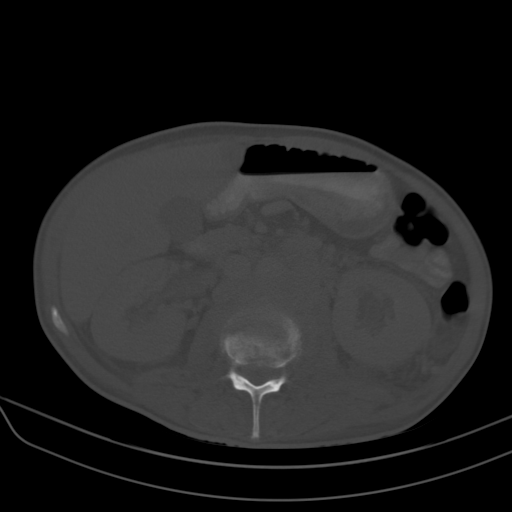
[im 26/39  soft-tissue]
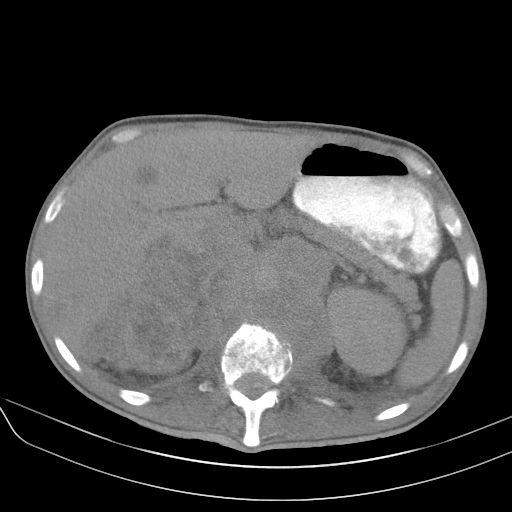
[im 26/39  lung]
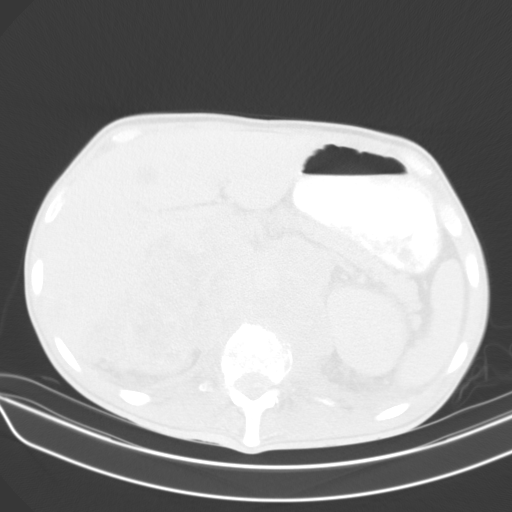

[13 of 32 positions shown; findings below may reference images not displayed]

FINDINGS: CT CHEST FINDINGS

Cardiovascular: Heart is normal in size and configuration mild
three-vessel coronary artery calcifications. No pericardial
effusion. Great vessels are normal in caliber. No aortic
atherosclerosis.

Mediastinum/Nodes: Enlarged left neck base lymph nodes, largest
measuring 2.9 cm in short axis. Thyroid is unremarkable.

No axillary masses or enlarged lymph nodes.

No mediastinal or hilar masses. Mildly enlarged left hilar lymph
node 1.2 cm in short axis.

Trachea and esophagus are unremarkable.

Lungs/Pleura: Numerous pulmonary nodules bilaterally. Several
dominant nodules or measure. Largest left nodule, posteromedial left
lower lobe, image 111, series [DATE] cm. Largest right lung nodule,
right lower lobe, image 109, 1.6 x 1.1 cm.

No lung consolidation or pulmonary edema. Trace left pleural
effusion. No pneumothorax.

CT ABDOMEN PELVIS FINDINGS

Hepatobiliary: Multiple liver masses. Largest mass arising from the
inferior right lobe, segment 6, 2.3 cm. Largest lesion from the left
lobe, segment 4B, 2.3 cm

Liver normal in overall size and attenuation. Gallbladder is
unremarkable. No bile duct dilation.

Pancreas: Unremarkable. No pancreatic ductal dilatation or
surrounding inflammatory changes.

Spleen: Normal in size without focal abnormality.

Adrenals/Urinary Tract: Heterogeneous mass from the upper pole of
the right kidney, measuring approximately 7.7 x 6.3 x 7.1 cm. This
mass is contiguous with a larger hypoattenuating infiltrative mass
at surrounds and significantly narrows and encases the inferior vena
cava, encases the abdominal aorta, celiac axis, superior mesenteric
artery and renal arteries and extends to the lower thoracic and
upper lumbar spine. The mass abuts and thins the portal vein without
evidence of invasion. This contiguous mass along with the renal mass
measures a combined 18 x 11 x 12 cm. The mass contacts the medial
margin of the left kidney. It broadly abuts the posterior aspect of
the liver. It involves the vertebra eroding portions of the T12
through L3 vertebral bodies. Mass obscures the right adrenal gland.

Mass involves the medial limb of the left adrenal gland. Lateral
limb is unremarkable. Left kidney is normal in size and attenuation.
Mild left hydronephrosis. No left renal mass or stone. Ureters
normal in course and in caliber. Bladder is unremarkable.

Stomach/Bowel: Normal stomach. Small bowel and colon are normal in
caliber. No wall thickening. No inflammation. No involvement by the
above described mass. Normal appendix visualized.

Vascular/Lymphatic: Mildly enlarged Peri celiac lymph node measuring
1.4 cm short axis separate from the large mass. Left periaortic
lymph node at the level of the lower pole the left kidney, 1.1 cm in
short axis.

Mild aortic atherosclerosis. Portal vein is narrowed by the mass but
not occluded. No convincing invasion.

Reproductive: Unremarkable.

Other: Small amount ascites in the posterior pelvic recess.

MUSCULOSKELETAL FINDINGS

2 widespread metastatic skeletal disease reflected by numerous
lucent bone lesions. Lesions are visualized throughout the spine,
pelvis, sternum, left scapula and several ribs. There are expansile
lesions from the right anterior fourth rib and left anterior second
rib. There are multiple pathologic compression fractures including
T2, T4, T5, T6, T8, T12, L1, L2 and L3.
IMPRESSION: 1. Large retroperitoneal mass with extensive metastatic disease.
This mass is most likely a renal cell carcinoma arising from the
upper pole the right kidney, although the mass could potentially be
primary with secondary involvement of the kidney. Mass surrounds the
aorta, its branch vessels, the inferior vena cava and abuts and
thins the portal vein. Overall dimensions of the retroperitoneal
mass are 1.8 x 1.2 x 1.1 cm.
2. Metastatic disease includes retroperitoneal adenopathy, left neck
base adenopathy, multiple lung nodules, multiple liver metastatic
lesions and extensive osseous metastatic disease including direct
osseous invasion by the retroperitoneal mass.

## 2020-12-30 IMAGING — US US BIOPSY LYMPH NODE
1 series · 13 of 20 positions shown · non-contrast
Comparison: CT the chest, abdomen and pelvis-06/17/2019

INDICATION: Concern for metastatic RCC versus lymphoma. Please perform CT-guided
biopsy for tissue diagnostic purposes.

EXAM:
ULTRASOUND-GUIDED LEFT SUPRACLAVICULAR LYMPH NODE BIOPSY
TECHNIQUE: Informed written consent was obtained from the patient after a
discussion of the risks, benefits and alternatives to treatment.
Questions regarding the procedure were encouraged and answered.
Initial ultrasound scanning demonstrated a large, at least 5.2 x
x 3.8 cm left supraclavicular mass correlating with the nodal
conglomeration seen on preceding chest CT images 1 - 6, series 6).
An ultrasound image was saved for documentation purposes. The
procedure was planned. A timeout was performed prior to the
initiation of the procedure.

[Series 1: us biopsy lymph node · 13 of 20 slices shown]
[im 1/20]
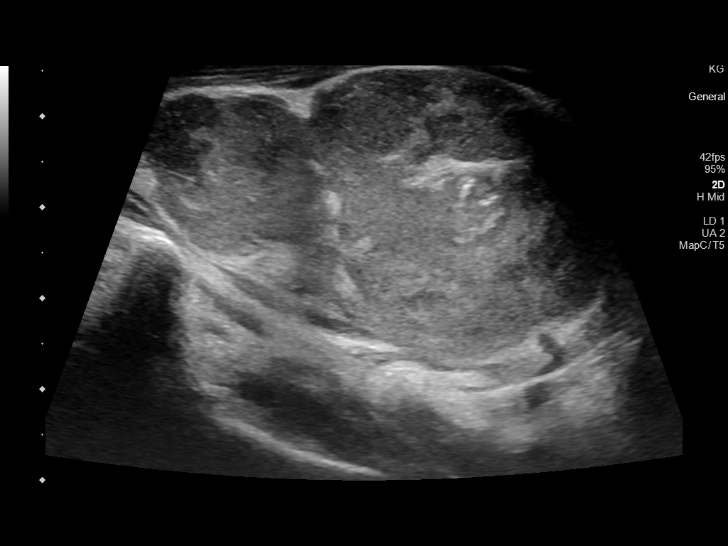
[im 3/20]
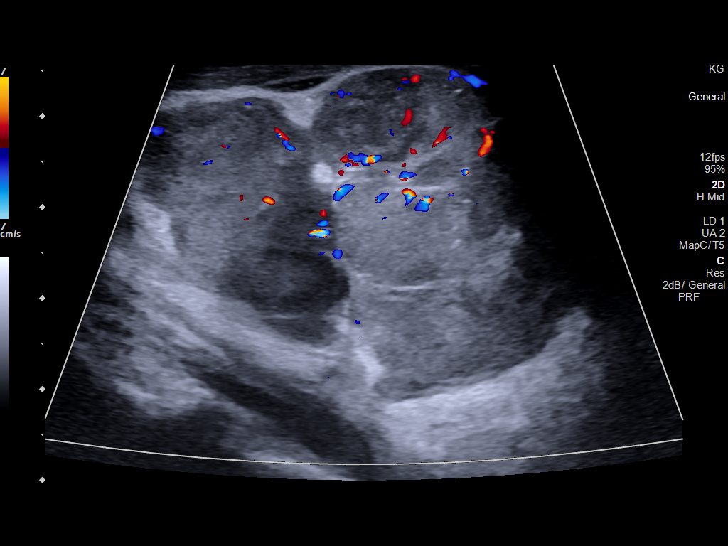
[im 4/20]
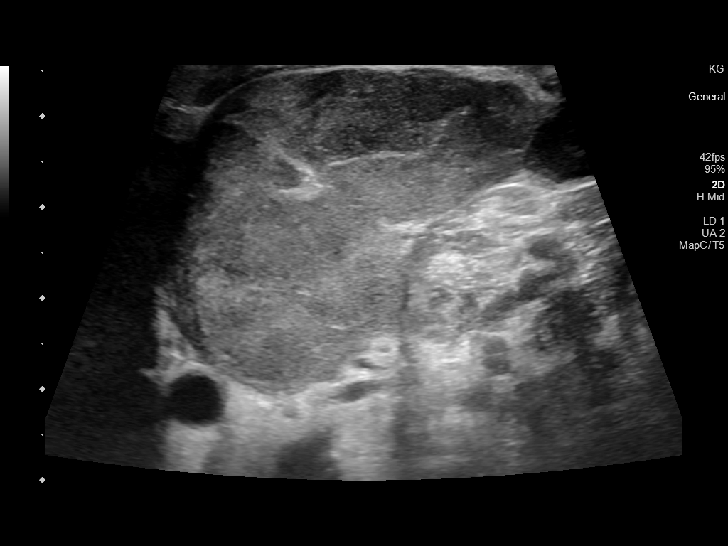
[im 6/20]
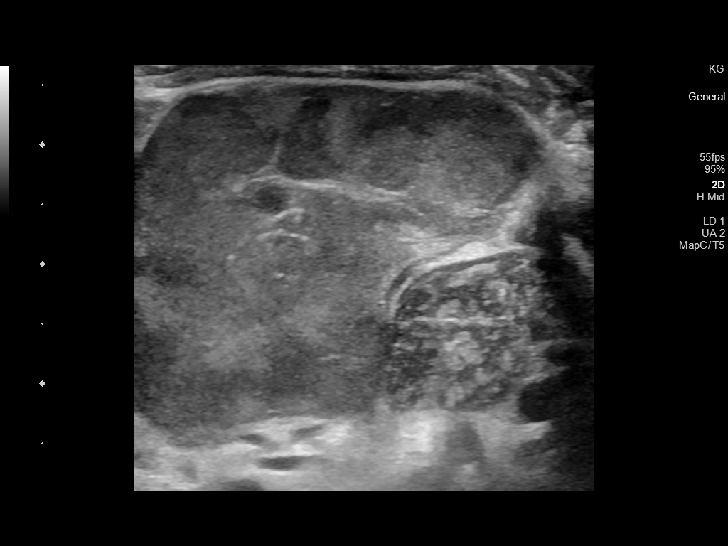
[im 7/20]
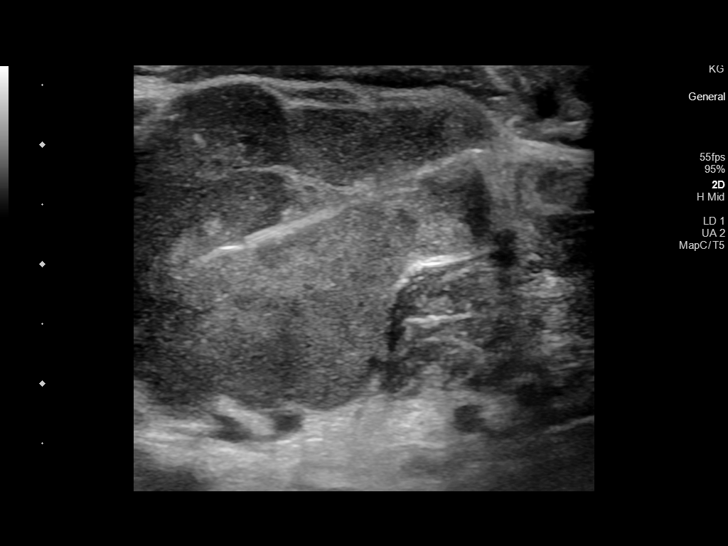
[im 9/20]
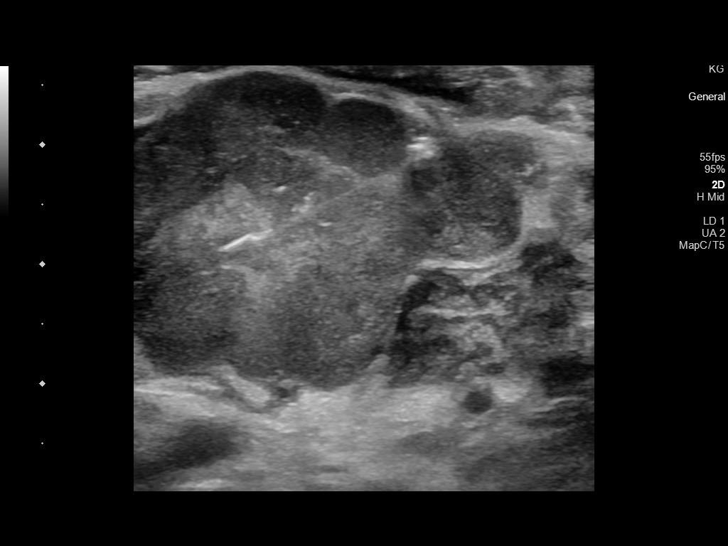
[im 11/20]
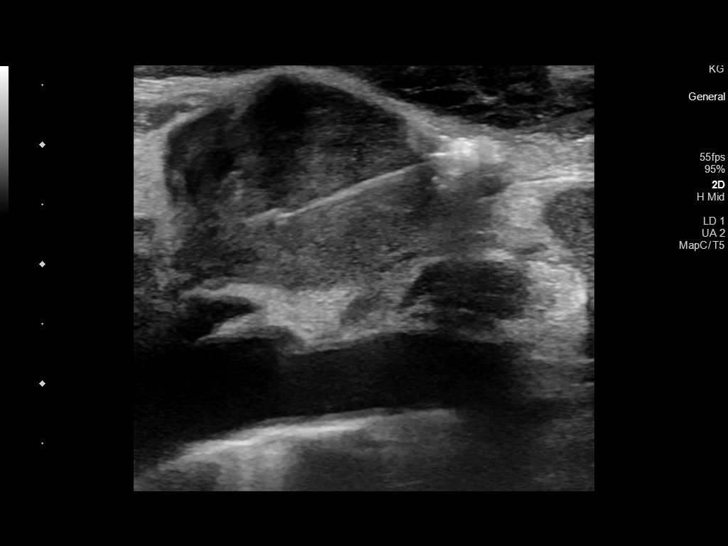
[im 12/20]
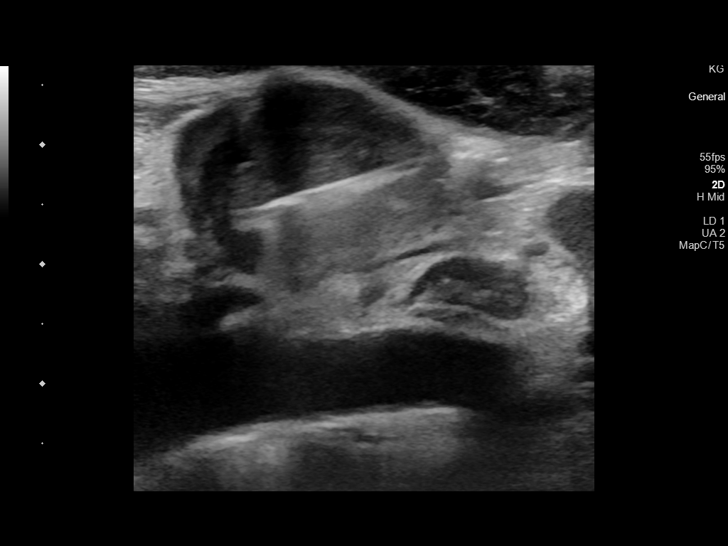
[im 14/20]
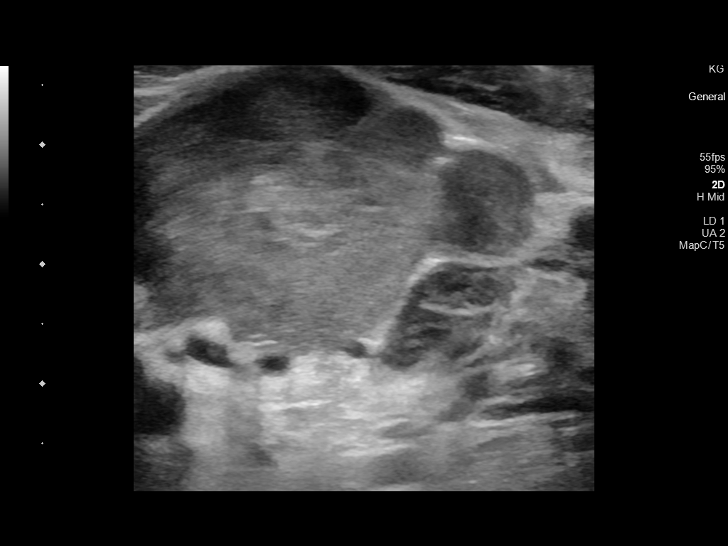
[im 15/20]
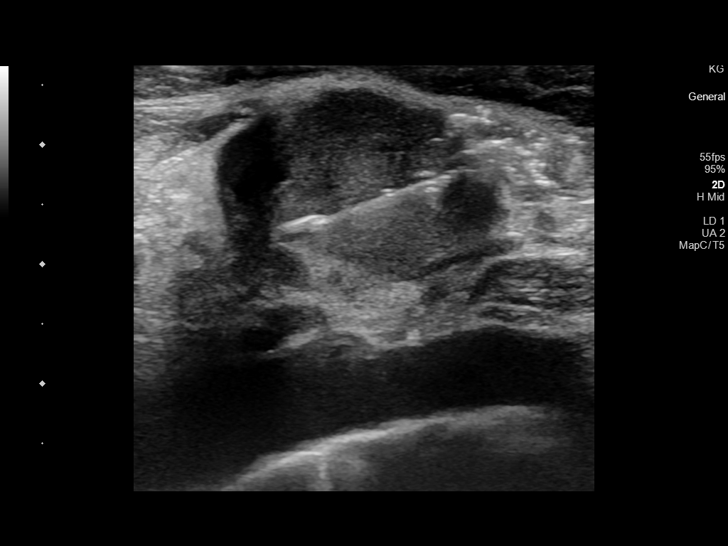
[im 17/20]
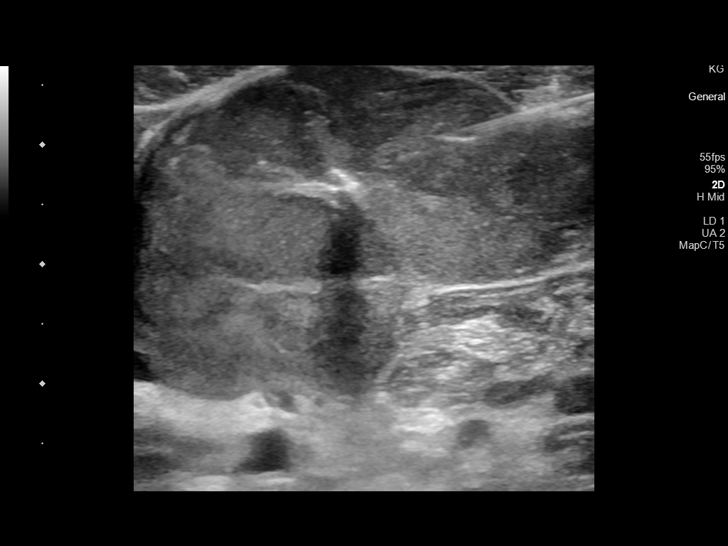
[im 18/20]
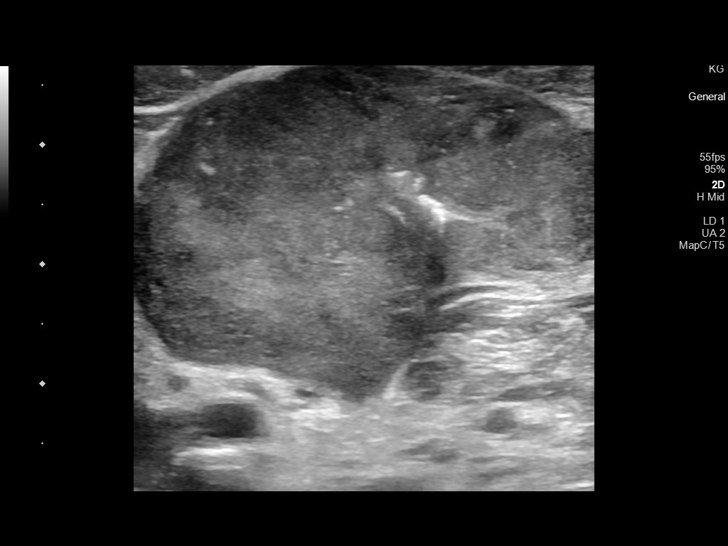
[im 20/20]
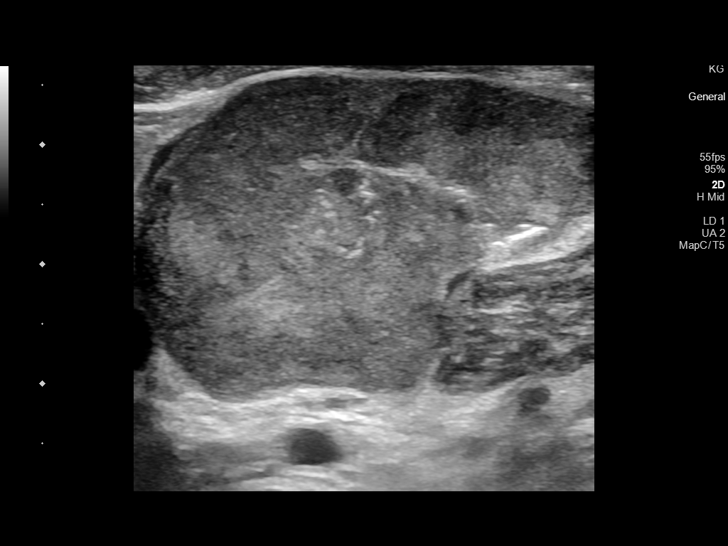

[13 of 20 positions shown; findings below may reference images not displayed]

MEDICATIONS:
None

ANESTHESIA/SEDATION:
Moderate (conscious) sedation was employed during this procedure. A
total of Versed 2 mg and Fentanyl 50 mcg was administered
intravenously.

Moderate Sedation Time: 11 minutes. The patient's level of
consciousness and vital signs were monitored continuously by
radiology nursing throughout the procedure under my direct
supervision.

COMPLICATIONS:
None immediate.
The operative was prepped and draped in the usual sterile fashion,
and a sterile drape was applied covering the operative field. A
timeout was performed prior to the initiation of the procedure.
Local anesthesia was provided with 1% lidocaine with epinephrine.

Under direct ultrasound guidance, an 18 gauge core needle device was
utilized to obtain to obtain 7 core needle biopsies of the dominant
left supraclavicular nodal conglomeration.

The samples were placed in saline and submitted to pathology. The
needle was removed and hemostasis was achieved with manual
compression. Post procedure scan was negative for significant
hematoma. A dressing was placed. The patient tolerated the procedure
well without immediate postprocedural complication.
IMPRESSION: Technically successful ultrasound guided biopsy of dominant left
supraclavicular nodal conglomeration.
